# Patient Record
Sex: Female | Born: 1960 | Race: White | Hispanic: No | Marital: Married | Smoking: Never smoker
Health system: Southern US, Community
[De-identification: ages and names within clinical notes are randomized; demographics above are authoritative.]

## PROBLEM LIST (undated history)

## (undated) DIAGNOSIS — I499 Cardiac arrhythmia, unspecified: Secondary | ICD-10-CM

## (undated) DIAGNOSIS — F419 Anxiety disorder, unspecified: Secondary | ICD-10-CM

## (undated) DIAGNOSIS — I10 Essential (primary) hypertension: Secondary | ICD-10-CM

## (undated) DIAGNOSIS — F329 Major depressive disorder, single episode, unspecified: Secondary | ICD-10-CM

## (undated) DIAGNOSIS — M1990 Unspecified osteoarthritis, unspecified site: Secondary | ICD-10-CM

## (undated) DIAGNOSIS — J189 Pneumonia, unspecified organism: Secondary | ICD-10-CM

## (undated) DIAGNOSIS — M5412 Radiculopathy, cervical region: Secondary | ICD-10-CM

## (undated) DIAGNOSIS — F32A Depression, unspecified: Secondary | ICD-10-CM

## (undated) DIAGNOSIS — N189 Chronic kidney disease, unspecified: Secondary | ICD-10-CM

## (undated) DIAGNOSIS — E669 Obesity, unspecified: Secondary | ICD-10-CM

## (undated) DIAGNOSIS — D249 Benign neoplasm of unspecified breast: Secondary | ICD-10-CM

## (undated) DIAGNOSIS — K219 Gastro-esophageal reflux disease without esophagitis: Secondary | ICD-10-CM

## (undated) DIAGNOSIS — M5124 Other intervertebral disc displacement, thoracic region: Secondary | ICD-10-CM

## (undated) HISTORY — PX: CHOLECYSTECTOMY: SHX55

## (undated) HISTORY — PX: ABDOMINAL HYSTERECTOMY: SHX81

## (undated) HISTORY — PX: KNEE ARTHROSCOPY: SHX127

## (undated) HISTORY — DX: Major depressive disorder, single episode, unspecified: F32.9

## (undated) HISTORY — DX: Depression, unspecified: F32.A

## (undated) HISTORY — PX: KNEE ARTHROSCOPY: SUR90

## (undated) HISTORY — DX: Pneumonia, unspecified organism: J18.9

## (undated) HISTORY — DX: Unspecified osteoarthritis, unspecified site: M19.90

## (undated) HISTORY — DX: Essential (primary) hypertension: I10

## (undated) HISTORY — PX: BREAST LUMPECTOMY: SHX2

## (undated) HISTORY — DX: Benign neoplasm of unspecified breast: D24.9

---

## 2010-01-11 ENCOUNTER — Ambulatory Visit: Payer: Self-pay | Admitting: Internal Medicine

## 2010-01-11 ENCOUNTER — Telehealth (INDEPENDENT_AMBULATORY_CARE_PROVIDER_SITE_OTHER): Payer: Self-pay | Admitting: *Deleted

## 2010-01-11 DIAGNOSIS — F329 Major depressive disorder, single episode, unspecified: Secondary | ICD-10-CM | POA: Insufficient documentation

## 2010-02-08 ENCOUNTER — Ambulatory Visit: Payer: Self-pay | Admitting: Internal Medicine

## 2010-05-23 ENCOUNTER — Ambulatory Visit: Payer: Self-pay | Admitting: Internal Medicine

## 2010-05-24 ENCOUNTER — Encounter: Payer: Self-pay | Admitting: Internal Medicine

## 2010-06-02 ENCOUNTER — Emergency Department (HOSPITAL_COMMUNITY): Admission: EM | Admit: 2010-06-02 | Discharge: 2010-06-02 | Payer: Self-pay | Admitting: Emergency Medicine

## 2010-06-13 ENCOUNTER — Ambulatory Visit: Payer: Self-pay | Admitting: Internal Medicine

## 2010-06-13 DIAGNOSIS — M542 Cervicalgia: Secondary | ICD-10-CM

## 2010-06-13 LAB — CONVERTED CEMR LAB
ALT: 47 units/L — ABNORMAL HIGH (ref 0–35)
AST: 29 units/L (ref 0–37)
Basophils Absolute: 0 10*3/uL (ref 0.0–0.1)
GFR calc non Af Amer: 71.5 mL/min (ref >60)
HCT: 36.7 % (ref 36.0–46.0)
Lymphs Abs: 2.3 10*3/uL (ref 0.7–4.0)
MCHC: 35.2 g/dL (ref 30.0–36.0)
MCV: 91.9 fL (ref 78.0–100.0)
Monocytes Absolute: 0.7 10*3/uL (ref 0.1–1.0)
RDW: 13.1 % (ref 11.5–14.6)
TSH: 0.99 microintl units/mL (ref 0.35–5.50)
Total Bilirubin: 0.5 mg/dL (ref 0.3–1.2)
WBC: 6.7 10*3/uL (ref 4.5–10.5)

## 2010-08-08 ENCOUNTER — Ambulatory Visit: Payer: Self-pay | Admitting: Internal Medicine

## 2010-08-08 DIAGNOSIS — M65839 Other synovitis and tenosynovitis, unspecified forearm: Secondary | ICD-10-CM

## 2010-08-08 DIAGNOSIS — M65849 Other synovitis and tenosynovitis, unspecified hand: Secondary | ICD-10-CM

## 2010-12-18 ENCOUNTER — Encounter: Payer: Self-pay | Admitting: Gastroenterology

## 2010-12-30 ENCOUNTER — Encounter: Payer: Self-pay | Admitting: Internal Medicine

## 2011-01-08 NOTE — Letter (Signed)
Summary: Results Follow-up Letter  [facility]  [address] [place], [place] [address]   Phone: [phone]  Fax: [phone]    05/24/2010  [address] [place], [place]  [address]  Dear Shari Ruiz,   The following are the results of your recent test(s):  Test     Result     Wrist Xray     normal   _________________________________________________________  Please call for an appointment in 2-3 weeks _________________________________________________________ _________________________________________________________ _________________________________________________________  Sincerely,  Sanda Linger MD [facility]

## 2011-01-08 NOTE — Assessment & Plan Note (Signed)
Summary: NEW [employer] PT--PKG/OFF---STC   Vital Signs:  Patient profile:   50 year old female Height:      66 inches Weight:      220 pounds BMI:     35.64 O2 Sat:      99 % on Room air Temp:     97.6 degrees F oral Pulse rate:   82 / minute Pulse rhythm:   regular Resp:     16 per minute BP sitting:   104 / 68  (left arm) Cuff size:   large  Vitals Entered By: Rock Nephew CMA (January 11, 2010 1:35 PM)  Nutrition Counseling: Patient's BMI is greater than 25 and therefore counseled on weight management options.  O2 Flow:  Room air CC: dizziness, cold, weight gain, URI symptoms, Depression, Preventive Care Is Patient Diabetic? No Pain Assessment Patient in pain? no        Primary Care Provider:  Etta Grandchild MD  CC:  dizziness, cold, weight gain, URI symptoms, Depression, and Preventive Care.  History of Present Illness:  URI Symptoms      This is a 50 year old woman who presents with URI symptoms.  The symptoms began 3 weeks ago.  The severity is described as moderate.  The patient reports purulent nasal discharge, sore throat, and productive cough, but denies earache and sick contacts.  The patient denies fever, stiff neck, dyspnea, wheezing, rash, vomiting, diarrhea, and use of an antipyretic.  The patient denies headache, muscle aches, and severe fatigue.  Risk factors for Strep sinusitis include unilateral facial pain, unilateral nasal discharge, poor response to decongestant, and double sickening.    Depression History:      The patient presents with symptoms of depression which have been present for greater than two weeks.  The patient is having a depressed mood most of the day and has a diminished interest in her usual daily activities.  Positive alarm features for depression include significant weight loss, psychomotor retardation, fatigue (loss of energy), and feelings of worthlessness (guilt).  However, she denies significant weight gain, insomnia, hypersomnia,  psychomotor agitation, impaired concentration (indecisiveness), and recurrent thoughts of death or suicide.  The patient denies symptoms of a manic disorder including persistently & abnormally elevated mood, abnormally & persistently irritable mood, less need for sleep, talkative or feels need to keep talking, distractibility, flight of ideas, and excessive buying sprees.        Psychosocial stress factors include major life changes.  Risk factors for depression include a personal history of depression.  The patient denies that she feels like life is not worth living, denies that she wishes that she were dead, and denies that she has thought about ending her life.         Depression Treatment History:  Prior Medication Used:   Start Date: Assessment of Effect:   Comments:  Celexa (citalopram)     07/12/2009   side effects enough to stop   weight gain   Preventive Screening-Counseling & Management  Alcohol-Tobacco     Smoking Status: never  Caffeine-Diet-Exercise     Does Patient Exercise: yes      Drug Use:  no.    Medications Prior to Update: 1)  None  Current Medications (verified): 1)  None  Allergies (verified): No Known Drug Allergies  Past History:  Past Medical History: Depression  Past Surgical History: Hysterectomy Lumpectomy  Family History: Family History of Alcoholism/Addiction Family History of Arthritis Family History High cholesterol Family  History Hypertension  Social History: Married Never Smoked Alcohol use-yes Drug use-no Regular exercise-yes Smoking Status:  never Drug Use:  no Does Patient Exercise:  yes  Review of Systems       The patient complains of weight gain and depression.  The patient denies anorexia, fever, weight loss, chest pain, syncope, dyspnea on exertion, peripheral edema, prolonged cough, headaches, hemoptysis, abdominal pain, difficulty walking, and angioedema.   Psych:  Complains of anxiety, depression, easily tearful,  and irritability; denies alternate hallucination ( auditory/visual), easily angered, mental problems, panic attacks, sense of great danger, suicidal thoughts/plans, thoughts of violence, unusual visions or sounds, and thoughts /plans of harming others.  Physical Exam  General:  alert, well-developed, well-nourished, well-hydrated, appropriate dress, normal appearance, healthy-appearing, cooperative to examination, good hygiene, and overweight-appearing.   Head:  normocephalic, atraumatic, no abnormalities observed, and no abnormalities palpated.   Ears:  R ear normal and L ear normal.   Nose:  no airflow obstruction, no intranasal foreign body, no nasal polyps, no nasal mucosal lesions, no mucosal friability, no active bleeding or clots, no septum abnormalities, nasal dischargemucosal pallor, and L maxillary sinus tenderness.   Mouth:  Oral mucosa and oropharynx without lesions or exudates.  Teeth in good repair. Neck:  supple, full ROM, no masses, no thyromegaly, no JVD, normal carotid upstroke, no cervical lymphadenopathy, and no neck tenderness.   Lungs:  normal respiratory effort, no intercostal retractions, no accessory muscle use, normal breath sounds, no dullness, no fremitus, no crackles, and no wheezes.   Heart:  Normal rate and regular rhythm. S1 and S2 normal without gallop, murmur, click, rub or other extra sounds. Abdomen:  soft, non-tender, normal bowel sounds, no distention, no masses, no guarding, no rigidity, no hepatomegaly, and no splenomegaly.   Msk:  No deformity or scoliosis noted of thoracic or lumbar spine.   Pulses:  R and L carotid,radial,femoral,dorsalis pedis and posterior tibial pulses are full and equal bilaterally Extremities:  No clubbing, cyanosis, edema, or deformity noted with normal full range of motion of all joints.   Neurologic:  No cranial nerve deficits noted. Station and gait are normal. Plantar reflexes are down-going bilaterally. DTRs are symmetrical  throughout. Sensory, motor and coordinative functions appear intact. Skin:  Intact without suspicious lesions or rashes Cervical Nodes:  no anterior cervical adenopathy and no posterior cervical adenopathy.   Axillary Nodes:  no R axillary adenopathy and no L axillary adenopathy.   Inguinal Nodes:  no R inguinal adenopathy and no L inguinal adenopathy.   Psych:  Oriented X3, memory intact for recent and remote, normally interactive, good eye contact, not agitated, not suicidal, not homicidal, depressed affect, tearful, and slightly anxious.     Impression & Recommendations:  Problem # 1:  DEPRESSION (ICD-311) Assessment Deteriorated  Her updated medication list for this problem includes:    Cymbalta 30 Mg Cpep (Duloxetine hcl) ..... Once daily  Discussed treatment options, including trial of antidpressant medication. Will refer to behavioral health. Follow-up call in in 24-48 hours and recheck in 2 weeks, sooner as needed. Patient agrees to call if any worsening of symptoms or thoughts of doing harm arise. Verified that the patient has no suicidal ideation at this time.   Problem # 2:  SINUSITIS- ACUTE-NOS (ICD-461.9) Assessment: New  Her updated medication list for this problem includes:    Avelox 400 Mg Tabs (Moxifloxacin hcl) ..... One by mouth once daily for 7 days  Instructed on treatment. Call if symptoms persist or worsen.  Complete Medication List: 1)  Avelox 400 Mg Tabs (Moxifloxacin hcl) .... One by mouth once daily for 7 days 2)  Cymbalta 30 Mg Cpep (Duloxetine hcl) .... Once daily  PAP Screening:    Hx Cervical Dysplasia in last 5 yrs? No    3 normal PAP smears in last 5 yrs? Yes    Last PAP smear:  07/25/2009  PAP Smear Results:    Date of Exam:  07/25/2009    Results:  Normal  Mammogram Screening:    Last Mammogram:  08/01/2009  Mammogram Results:    Date of Exam:  08/01/2009    Results:  Normal Bilateral  Osteoporosis Risk Assessment:  Risk Factors for  Fracture or Low Bone Density:   Race (White or Asian):     yes   Smoking status:       never  Patient Instructions: 1)  Please schedule a follow-up appointment in 1 month. 2)  It is important that you exercise regularly at least 20 minutes 5 times a week. If you develop chest pain, have severe difficulty breathing, or feel very tired , stop exercising immediately and seek medical attention. 3)  You need to lose weight. Consider a lower calorie diet and regular exercise.  4)  Take your antibiotic as prescribed until ALL of it is gone, but stop if you develop a rash or swelling and contact our office as soon as possible. 5)  Acute sinusitis symptoms for less than 10 days are not helped by antibiotics.Use warm moist compresses, and over the counter decongestants ( only as directed). Call if no improvement in 5-7 days, sooner if increasing pain, fever, or new symptoms. Prescriptions: CYMBALTA 30 MG CPEP (DULOXETINE HCL) once daily  #42 x 0   Entered and Authorized by:   Etta Grandchild MD   Signed by:   Etta Grandchild MD on 01/11/2010   Method used:   Samples Given   RxID:   [id number] AVELOX 400 MG TABS (MOXIFLOXACIN HCL) One by mouth once daily for 7 days  #7 x 0   Entered and Authorized by:   Etta Grandchild MD   Signed by:   Etta Grandchild MD on 01/11/2010   Method used:   Samples Given   RxID:   [id number]

## 2011-01-08 NOTE — Assessment & Plan Note (Signed)
Summary: 3 mos f/u //#/cd   Vital Signs:  Patient profile:   50 year old female Height:      66 inches Weight:      227 pounds BMI:     36.77 O2 Sat:      95 % on Room air Temp:     97.9 degrees F oral Pulse rate:   89 / minute Pulse rhythm:   regular Resp:     16 per minute BP sitting:   122 / 82  (left arm) Cuff size:   large  Vitals Entered By: Lucious Groves (May 23, 2010 3:50 PM)  Nutrition Counseling: Patient's BMI is greater than 25 and therefore counseled on weight management options.  O2 Flow:  Room air CC: 3 mo f/u./kb Is Patient Diabetic? No Pain Assessment Patient in pain? yes     Location: hand Intensity: 1 Type: aching Onset of pain  4 weeks ago Comments Patient notes that she injured her hand 4 weeks ago and would like MD to look at it. Pain increases with usage./kb   Primary Care Provider:  Etta Grandchild MD  CC:  3 mo f/u./kb.  History of Present Illness: She returns complaining that she has injured her right wrist 2x in the last few weeks and she is concerned that it may be broken. She has pain over the lateral aspect of the wrist. She has not taken anything for pain.  Preventive Screening-Counseling & Management  Alcohol-Tobacco     Alcohol drinks/day: 0     Smoking Status: never  Caffeine-Diet-Exercise     PHQ-9 Score: 1-4 minimal depression  Hep-HIV-STD-Contraception     Hepatitis Risk: no risk noted     HIV Risk: no risk noted     STD Risk: no risk noted  Medications Prior to Update: 1)  Cymbalta 60 Mg Cpep (Duloxetine Hcl) .... One By Mouth Once Daily  Current Medications (verified): 1)  Cymbalta 60 Mg Cpep (Duloxetine Hcl) .... One By Mouth Once Daily 2)  Celebrex 200 Mg Caps (Celecoxib) .... One By Mouth Once Daily As Needed For Wrist Pain  Allergies (verified): No Known Drug Allergies  Past History:  Past Medical History: Last updated: 01/11/2010 Depression  Past Surgical History: Last updated:  01/11/2010 Hysterectomy Lumpectomy  Family History: Last updated: 01/11/2010 Family History of Alcoholism/Addiction Family History of Arthritis Family History High cholesterol Family History Hypertension  Social History: Last updated: 01/11/2010 Married Never Smoked Alcohol use-yes Drug use-no Regular exercise-yes  Risk Factors: Alcohol Use: 0 (05/23/2010) Exercise: yes (01/11/2010)  Risk Factors: Smoking Status: never (05/23/2010)  Family History: Reviewed history from 01/11/2010 and no changes required. Family History of Alcoholism/Addiction Family History of Arthritis Family History High cholesterol Family History Hypertension  Social History: Reviewed history from 01/11/2010 and no changes required. Married Never Smoked Alcohol use-yes Drug use-no Regular exercise-yes Hepatitis Risk:  no risk noted HIV Risk:  no risk noted STD Risk:  no risk noted  Review of Systems MS:  Complains of joint pain; denies joint redness, joint swelling, loss of strength, muscle aches, muscle, muscle weakness, and stiffness. Psych:  Denies alternate hallucination ( auditory/visual), anxiety, depression, easily angered, easily tearful, irritability, panic attacks, sense of great danger, and suicidal thoughts/plans.  Physical Exam  General:  alert, well-developed, well-nourished, well-hydrated, appropriate dress, normal appearance, healthy-appearing, cooperative to examination, good hygiene, and overweight-appearing.   Msk:  joint tenderness over the tip of the radius and mildly positive tinel's test. normal ROM, no joint swelling, no  joint warmth, no redness over joints, no joint deformities, no joint instability, no crepitation, no muscle atrophy, and joint tenderness.   Pulses:  R radial normal.   Extremities:  No clubbing, cyanosis, edema, or deformity noted with normal full range of motion of all joints.   Neurologic:  No cranial nerve deficits noted. Station and gait are  normal. Plantar reflexes are down-going bilaterally. DTRs are symmetrical throughout. Sensory, motor and coordinative functions appear intact. Skin:  Intact without suspicious lesions or rashes Psych:  Cognition and judgment appear intact. Alert and cooperative with normal attention span and concentration. No apparent delusions, illusions, hallucinations   Impression & Recommendations:  Problem # 1:  WRIST INJURY, RIGHT (ICD-959.3) will look for fracture and treat for sprain, contusion, and overuse syndrome with celebrex Orders: Splints- All Types (G4010) T-Wrist Comp Right (73110TC)  Problem # 2:  DEPRESSION (ICD-311) Assessment: Improved  Her updated medication list for this problem includes:    Cymbalta 60 Mg Cpep (Duloxetine hcl) ..... One by mouth once daily  Discussed treatment options, including trial of antidpressant medication. Will refer to behavioral health. Follow-up call in in 24-48 hours and recheck in 2 weeks, sooner as needed. Patient agrees to call if any worsening of symptoms or thoughts of doing harm arise. Verified that the patient has no suicidal ideation at this time.   Complete Medication List: 1)  Cymbalta 60 Mg Cpep (Duloxetine hcl) .... One by mouth once daily 2)  Celebrex 200 Mg Caps (Celecoxib) .... One by mouth once daily as needed for wrist pain  Patient Instructions: 1)  Please schedule a follow-up appointment in 2 weeks. 2)  It is important that you exercise regularly at least 20 minutes 5 times a week. If you develop chest pain, have severe difficulty breathing, or feel very tired , stop exercising immediately and seek medical attention. 3)  You need to lose weight. Consider a lower calorie diet and regular exercise.  4)  Take 650-1000mg  of Tylenol every 4-6 hours as needed for relief of pain or comfort of fever AVOID taking more than 4000mg   in a 24 hour period (can cause liver damage in higher doses). 5)  You may move around but avoid painful motions.  Apply ice to sore area for 20 minutes 3-4 times a day for 2-3 days. Prescriptions: CYMBALTA 60 MG CPEP (DULOXETINE HCL) One by mouth once daily  #105 x 0   Entered and Authorized by:   Etta Grandchild MD   Signed by:   Etta Grandchild MD on 05/23/2010   Method used:   Samples Given   RxID:   [id number] CELEBREX 200 MG CAPS (CELECOXIB) One by mouth once daily as needed for wrist pain  #30 x 0   Entered and Authorized by:   Etta Grandchild MD   Signed by:   Etta Grandchild MD on 05/23/2010   Method used:   Samples Given   RxID:   [id number]

## 2011-01-08 NOTE — Progress Notes (Signed)
Summary: Medical Release completed  Medical Release completed to obtain records from Dr. Willa Rough. After several failed fax attempts, records were mailed to [address] [place], [place] [address]. Shari Ruiz  January 11, 2010 3:10 PM

## 2011-01-08 NOTE — Assessment & Plan Note (Signed)
Summary: 4-6 WKS/[employer]   Vital Signs:  Patient profile:   50 year old female Height:      66 inches Weight:      219.75 pounds O2 Sat:      97 % on Room air Temp:     98.5 degrees F oral Pulse rate:   85 / minute Pulse rhythm:   regular Resp:     16 per minute BP sitting:   116 / 78  (left arm) Cuff size:   large  Vitals Entered By: Brenton Grills (February 08, 2010 8:30 AM)  O2 Flow:  Room air CC: follow-up visit/aj, Depression   Primary Care Provider:  Etta Grandchild MD  CC:  follow-up visit/aj and Depression.  History of Present Illness: She returns for f/up and reports that she is having a good response to Cymbalta 30 mg. She feels more motivated and food cravings have diminished.  Depression Treatment History:  Prior Medication Used:   Start Date: Assessment of Effect:   Comments:  Celexa (citalopram)     07/12/2009   side effects enough to stop   weight gain   Current Medications (verified): 1)  Avelox 400 Mg Tabs (Moxifloxacin Hcl) .... One By Mouth Once Daily For 7 Days 2)  Cymbalta 30 Mg Cpep (Duloxetine Hcl) .... Once Daily  Allergies (verified): No Known Drug Allergies  Past History:  Past Medical History: Reviewed history from 01/11/2010 and no changes required. Depression  Past Surgical History: Reviewed history from 01/11/2010 and no changes required. Hysterectomy Lumpectomy  Family History: Reviewed history from 01/11/2010 and no changes required. Family History of Alcoholism/Addiction Family History of Arthritis Family History High cholesterol Family History Hypertension  Social History: Reviewed history from 01/11/2010 and no changes required. Married Never Smoked Alcohol use-yes Drug use-no Regular exercise-yes  Review of Systems  The patient denies anorexia, weight loss, weight gain, chest pain, peripheral edema, abdominal pain, depression, fever, prolonged cough, and enlarged lymph nodes.   Psych:  Denies anxiety, depression,  easily angered, easily tearful, irritability, mental problems, panic attacks, sense of great danger, suicidal thoughts/plans, thoughts of violence, unusual visions or sounds, and thoughts /plans of harming others.  Physical Exam  General:  alert, well-developed, well-nourished, well-hydrated, appropriate dress, normal appearance, healthy-appearing, cooperative to examination, good hygiene, and overweight-appearing.   Mouth:  Oral mucosa and oropharynx without lesions or exudates.  Teeth in good repair. Neck:  supple, full ROM, no masses, no thyromegaly, no JVD, normal carotid upstroke, no cervical lymphadenopathy, and no neck tenderness.   Lungs:  normal respiratory effort, no intercostal retractions, no accessory muscle use, normal breath sounds, no dullness, no fremitus, no crackles, and no wheezes.   Heart:  Normal rate and regular rhythm. S1 and S2 normal without gallop, murmur, click, rub or other extra sounds. Abdomen:  soft, non-tender, normal bowel sounds, no distention, no masses, no guarding, no rigidity, no hepatomegaly, and no splenomegaly.   Msk:  No deformity or scoliosis noted of thoracic or lumbar spine.   Neurologic:  No cranial nerve deficits noted. Station and gait are normal. Plantar reflexes are down-going bilaterally. DTRs are symmetrical throughout. Sensory, motor and coordinative functions appear intact. Skin:  Intact without suspicious lesions or rashes Psych:  Oriented X3, memory intact for recent and remote, normally interactive, good eye contact, not anxious appearing, not depressed appearing, not agitated, not suicidal, and not homicidal.     Impression & Recommendations:  Problem # 1:  DEPRESSION (ICD-311) Assessment Improved  will  increase Cymbalta dose to a therapeutic level The following medications were removed from the medication list:    Cymbalta 30 Mg Cpep (Duloxetine hcl) ..... Once daily Her updated medication list for this problem includes:    Cymbalta  60 Mg Cpep (Duloxetine hcl) ..... One by mouth once daily  Discussed treatment options, including trial of antidpressant medication. Will refer to behavioral health. Follow-up call in in 24-48 hours and recheck in 2 weeks, sooner as needed. Patient agrees to call if any worsening of symptoms or thoughts of doing harm arise. Verified that the patient has no suicidal ideation at this time.   Complete Medication List: 1)  Cymbalta 60 Mg Cpep (Duloxetine hcl) .... One by mouth once daily  Patient Instructions: 1)  Please schedule a follow-up appointment in 3 months. 2)  It is important that you exercise regularly at least 20 minutes 5 times a week. If you develop chest pain, have severe difficulty breathing, or feel very tired , stop exercising immediately and seek medical attention. 3)  You need to lose weight. Consider a lower calorie diet and regular exercise.  Prescriptions: CYMBALTA 60 MG CPEP (DULOXETINE HCL) One by mouth once daily  #105 x 0   Entered and Authorized by:   Etta Grandchild MD   Signed by:   Etta Grandchild MD on 02/08/2010   Method used:   Samples Given   RxID:   [id number]
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
## 2011-01-10 NOTE — Letter (Signed)
Summary: New Patient letter  [facility]  [address] [place], [place] [address]   Phone: [phone]  Fax: [phone]       12/18/2010 MRN: [id number]  Shari Ruiz [address] [place], [place]  [address]  Dear Shari Ruiz,  Welcome to the Gastroenterology Division at [facility]    You are scheduled to see Dr.  Christella Hartigan on 01-23-2011 at 11am on the 3rd floor at [facility], [address]  We ask that you try to arrive at our office 15 minutes prior to your appointment time to allow for check-in.  We would like you to complete the enclosed self-administered evaluation form prior to your visit and bring it with you on the day of your appointment.  We will review it with you.  Also, please bring a complete list of all your medications or, if you prefer, bring the medication bottles and we will list them.  Please bring your insurance card so that we may make a copy of it.  If your insurance requires a referral to see a specialist, please bring your referral form from your primary care physician.  Co-payments are due at the time of your visit and may be paid by cash, check or credit card.     Your office visit will consist of a consult with your physician (includes a physical exam), any laboratory testing he/she may order, scheduling of any necessary diagnostic testing (e.g. x-ray, ultrasound, CT-scan), and scheduling of a procedure (e.g. Endoscopy, Colonoscopy) if required.  Please allow enough time on your schedule to allow for any/all of these possibilities.    If you cannot keep your appointment, please call [phone] to cancel or reschedule prior to your appointment date.  This allows [place] the opportunity to schedule an appointment for another patient in need of care.  If you do not cancel or reschedule by 5 p.m. the business day prior to your appointment date, you will be charged a $50.00 late cancellation/no-show fee.    Thank you for choosing [facility] for your medical needs.  We appreciate the opportunity to care for you.  Please visit [place] at our website  to learn more about our practice.                     Sincerely,                                                             The Gastroenterology Division

## 2011-01-23 ENCOUNTER — Encounter: Payer: Self-pay | Admitting: Gastroenterology

## 2011-01-23 ENCOUNTER — Ambulatory Visit (INDEPENDENT_AMBULATORY_CARE_PROVIDER_SITE_OTHER): Payer: BC Managed Care – PPO | Admitting: Gastroenterology

## 2011-01-23 DIAGNOSIS — R05 Cough: Secondary | ICD-10-CM

## 2011-01-30 NOTE — Assessment & Plan Note (Signed)
History of Present Illness Visit Type: Initial Consult Primary GI MD: Rob Bunting MD Primary Provider: Etta Grandchild MD Requesting Provider: Marisue [place], DO Chief Complaint: Since October pt has hoarseness and coughing. Pt has some LUQ abd pressure and intermittant nausea/ diarhea.  History of Present Illness:     very pleasant 50 year old woman who this past october, was sick with vomiting, cough, fever.  She still has a cough, she was told it was GERD related.  Was put on antiacid med and allergy med daily.  She still has cough and hoarsness.  this is a dry cough she started ACE inhibitor about 2-3 months ago.  she also has left upper quadrant discomfort.  This starts like a pushing, then is sharp and around to left back. Feels it every day, but sharp pains only about once a day.  Had [place] but no CT.  she has lost 10 pounds since october.  she had similar symptoms about 4 years ago..."every test under the sun" was done.  had colonoscopy/egd were done, she was told she had a "severe" infection and was put on a z pack.  she had a "colonic" clensing of colon here in [place] 2 months ago.             Current Medications (verified): 1)  Lisinopril-Hydrochlorothiazide 10-12.5 Mg Tabs (Lisinopril-Hydrochlorothiazide) .... One Tablet By Mouth Once Daily 2)  [facility] Allergy 25 Mg Tabs (Diphenhydramine Hcl) .... One Tablet By Mouth Once Daily 3)  Ranitidine Hcl 150 Mg Tabs (Ranitidine Hcl) .... 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  All other review of systems was otherwise negative.   Vital Signs:  Patient profile:   50 year old female Height:      66 inches Weight:      227.13 pounds BMI:     36.79 Pulse rate:   80 / minute Pulse rhythm:   regular BP sitting:   128 / 70  (left arm) Cuff size:   large  Vitals Entered By: Christie Nottingham CMA Duncan Dull) (January 23, 2011 11:08 AM)  Physical Exam  Additional Exam:  Constitutional: generally well appearing Psychiatric: alert and oriented times 3 Eyes: extraocular movements intact Mouth: oropharynx moist, no lesions Neck: supple, no lymphadenopathy Cardiovascular: heart regular rate and rythm Lungs: CTA bilaterally Abdomen: soft, non-tender, non-distended, no obvious ascites, no peritoneal signs, normal bowel sounds Extremities: no lower extremity edema bilaterally Skin: no lesions on visible extremities    Impression & Recommendations:  Problem # 1:  Cough perhaps ACE inhibitor related. Could be GERD related but she doesn't have other more classic signs of GERD such as pyrosis. I recommended she stop her ACE inhibitor. She will call my office in about 3 weeks to report on her symptoms. If she has not improved within we will start her on samples of proton pump inhibitor. We will get copies of her colonoscopy, upper endoscopy report sent  from [facility] for review.  Patient Instructions: 1)  We will get the results from colonoscopy and upper endoscopy that were done in [place] 4 years ago. 2)  Stop your lisinopril (cough is a common side effect of this type of medicine).  Call your PCP about a diffent BP med for now.  Call Dr. Christella Hartigan' office in 3-4 weeks to report on your symptoms.  If NO improvement, then will change your antiacid meds to stronger class  (PPI class of meds). 3)  You may need repeat endoscopy or colonoscopy, but that will depend on symptoms and review of previous EGD/colonoscopy. 4)  A copy of this information will be sent to Dr. Elisabeth Most. 5)  The medication list was reviewed and reconciled.  All changed / newly prescribed medications were explained.  A complete medication list was provided to the patient / caregiver.

## 2011-02-07 ENCOUNTER — Encounter (INDEPENDENT_AMBULATORY_CARE_PROVIDER_SITE_OTHER): Payer: Self-pay | Admitting: *Deleted

## 2011-02-07 ENCOUNTER — Telehealth: Payer: Self-pay | Admitting: Gastroenterology

## 2011-02-12 ENCOUNTER — Encounter (INDEPENDENT_AMBULATORY_CARE_PROVIDER_SITE_OTHER): Payer: Self-pay | Admitting: *Deleted

## 2011-02-14 ENCOUNTER — Encounter: Payer: Self-pay | Admitting: Gastroenterology

## 2011-02-14 NOTE — Progress Notes (Signed)
Summary: Triage  Phone Note Call from Patient Call back at [phone]   Caller: Patient Call For: Dr. Christella Hartigan Reason for Call: Talk to Nurse Summary of Call: "sick and tired of not feeling well", abd pain, bloating, gas. Her PCP prescribed her Align and it is causing extreme constipation Initial call taken by: Karna Christmas,  February 07, 2011 8:30 AM  Follow-up for Phone Call        resent release to [facility] for Dr Christella Hartigan review. I also called the facility and they state the pt has never been seen there before.  I ahve placed a call to the pt to gather further information left message on machine to call back   Chales Abrahams CMA Duncan Dull)  February 07, 2011 8:37 AM   Additional Follow-up for Phone Call Additional follow up Details #1::        pt has a "pulsing pain" aroung the belly button and left side is very bloated and she feels very fatigued.  She also has the same cough.  She is very anxious about colon cancer in the family.  [facility] has no record of the pt and the the pt cant remember any other info regarding her procedures.  She wants to be scheduled for a Colon Endo.  She is very tired of feeling bad and hurting.  Please advise. Additional Follow-up by: Chales Abrahams CMA Duncan Dull),  February 07, 2011 9:15 AM    Additional Follow-up for Phone Call Additional follow up Details #2::    since we cannot find her previous EGD/colon reports and patient is unable to help [place] locate them any further, we will have to repeat those tests. [facility] with propofol (EGD and colonoscopy). Follow-up by: Rachael Fee MD,  February 07, 2011 9:43 AM  Additional Follow-up for Phone Call Additional follow up Details #3:: Details for Additional Follow-up Action Taken: left message on machine to call back Chales Abrahams CMA Duncan Dull)  February 07, 2011 10:19 AM   pt scheduled for endo colon and previsit pt aware previsit letter mailed. Additional Follow-up by: Chales Abrahams CMA Duncan Dull),  February 07, 2011 10:44 AM

## 2011-02-14 NOTE — Letter (Signed)
Summary: Previsit letter  [facility]  [address] [place], [place] [address]   Phone: [phone]  Fax: [phone]       02/07/2011 MRN: [id number]  Shari Ruiz [address] [place], [place]  [address]  [place]  Dear [name],  Welcome to the Gastroenterology Division at [facility].    You are scheduled to see a nurse for your pre-procedure visit on 02/14/11  at 4 pm on the 3rd floor at [facility], [address]  We ask that you try to arrive at our office 15 minutes prior to your appointment time to allow for check-in.  Your nurse visit will consist of discussing your medical and surgical history, your immediate family medical history, and your medications.    Please bring a complete list of all your medications or, if you prefer, bring the medication bottles and we will list them.  We will need to be aware of both prescribed and over the counter drugs.  We will need to know exact dosage information as well.  If you are on blood thinners (Coumadin, Plavix, Aggrenox, Ticlid, etc.) please call our office today/prior to your appointment, as we need to consult with your physician about holding your medication.   Please be prepared to read and sign documents such as consent forms, a financial agreement, and acknowledgement forms.  If necessary, and with your consent, a friend or relative is welcome to sit-in on the nurse visit with you.  Please bring your insurance card so that we may make a copy of it.  If your insurance requires a referral to see a specialist, please bring your referral form from your primary care physician.  No co-pay is required for this nurse visit.     If you cannot keep your appointment, please call [phone] to cancel or reschedule prior to your appointment date.  This allows [place] the opportunity to schedule an appointment for another patient in need of care.    Thank you for choosing [facility] for your medical needs.   We appreciate the opportunity to care for you.  Please visit [place] at our website  to learn more about our practice.                     Sincerely.                                                                                                                   The Gastroenterology Division

## 2011-02-19 NOTE — Miscellaneous (Signed)
Summary: [facility]  Clinical Lists Changes  Medications: Added new medication of MOVIPREP 100 GM  SOLR (PEG-KCL-NACL-NASULF-NA ASC-C) As per prep instructions. - Signed Rx of MOVIPREP 100 GM  SOLR (PEG-KCL-NACL-NASULF-NA ASC-C) As per prep instructions.;  #1 x 0;  Signed;  Entered by: Ezra Sites RN;  Authorized by: Rachael Fee MD;  Method used: Electronically to [facility] # [phone]*, [address] [place], [place], [place]  [address], Ph: [phone], Fax: [phone] Observations: Added new observation of NKA: T (02/14/2011 15:59)    Prescriptions: MOVIPREP 100 GM  SOLR (PEG-KCL-NACL-NASULF-NA ASC-C) As per prep instructions.  #1 x 0   Entered by:   Ezra Sites RN   Authorized by:   Rachael Fee MD   Signed by:   Ezra Sites RN on 02/14/2011   Method used:   Electronically to        [facility] # [phone]* (retail)       [address] [place], [place]  [address]       Ph: [phone]       Fax: [phone]   RxID:   [id number]

## 2011-02-19 NOTE — Letter (Signed)
Summary: [facility] Instructions   Gastroenterology  [address] [place], [place] [address]   Phone: [phone]  Fax: [phone]       Shari Ruiz    07-08-1961    MRN: [id number]        Procedure Day Dorna Bloom: Lenor Coffin 02/21/11     Arrival Time: 10:15am      Procedure Time:12:15pm     Location of Procedure:                     Juliann Pares _  [facility] ( Outpatient Registration)                       PREPARATION FOR COLONOSCOPY WITH MOVIPREP   Starting 5 days prior to your procedure  SATURDAY 03/10  do not eat nuts, seeds, popcorn, corn, beans, peas,  salads, or any raw vegetables.  Do not take any fiber supplements (e.g. Metamucil, Citrucel, and Benefiber).  THE DAY BEFORE YOUR PROCEDURE         DATE: [facility]  03/14  1.  Drink clear liquids the entire day-NO SOLID FOOD  2.  Do not drink anything colored red or purple.  Avoid juices with pulp.  No orange juice.  3.  Drink at least 64 oz. (8 glasses) of fluid/clear liquids during the day to prevent dehydration and help the prep work efficiently.  CLEAR LIQUIDS INCLUDE: Water Jello Ice Popsicles Tea (sugar ok, no milk/cream) Powdered fruit flavored drinks Coffee (sugar ok, no milk/cream) Gatorade Juice: apple, white grape, white cranberry  Lemonade Clear bullion, consomm, broth Carbonated beverages (any kind) Strained chicken noodle soup Hard Candy                             4.  In the morning, mix first dose of MoviPrep solution:    Empty 1 Pouch A and 1 Pouch B into the disposable container    Add lukewarm drinking water to the top line of the container. Mix to dissolve    Refrigerate (mixed solution should be used within 24 hrs)  5.  Begin drinking the prep at 5:00 p.m. The MoviPrep container is divided by 4 marks.   Every 15 minutes drink the solution down to the next mark (approximately 8 oz) until the full liter is complete.   6.  Follow completed prep with 16 oz of clear liquid of your  choice (Nothing red or purple).  Continue to drink clear liquids until bedtime.  7.  Before going to bed, mix second dose of MoviPrep solution:    Empty 1 Pouch A and 1 Pouch B into the disposable container    Add lukewarm drinking water to the top line of the container. Mix to dissolve    Refrigerate  THE DAY OF YOUR PROCEDURE      DATE: THURSDAY  03/15  Beginning at  7:15a.m. (5 hours before procedure):         1. Every 15 minutes, drink the solution down to the next mark (approx 8 oz) until the full liter is complete.  2. Follow completed prep with 16 oz. of clear liquid of your choice.    3. NPO PAST MIDNIGHT EXCEPT FOR PREP!   MEDICATION INSTRUCTIONS  Unless otherwise instructed, you should take regular prescription medications with a small sip of water   as early as possible the morning of your procedure.   Additional  medication instructions: _If you take a fluid pill, do not take day of procedure.       OTHER INSTRUCTIONS  You will need a responsible adult at least 51 years of age to accompany you and drive you home.   This person must remain in the waiting room during your procedure.  Wear loose fitting clothing that is easily removed.  Leave jewelry and other valuables at home.  However, you may wish to bring a book to read or  an iPod/MP3 player to listen to music as you wait for your procedure to start.  Remove all body piercing jewelry and leave at home.  Total time from sign-in until discharge is approximately 2-3 hours.  You should go home directly after your procedure and rest.  You can resume normal activities the  day after your procedure.  The day of your procedure you should not:   Drive   Make legal decisions   Operate machinery   Drink alcohol   Return to work  You will receive specific instructions about eating, activities and medications before you leave.    The above instructions have been reviewed and explained to me by   Ezra Sites RN  February 14, 2011 4:13 PM   I fully understand and can verbalize these instructions _____________________________ Date _________

## 2011-02-21 ENCOUNTER — Encounter: Payer: Self-pay | Admitting: Gastroenterology

## 2011-02-21 ENCOUNTER — Encounter: Payer: BC Managed Care – PPO | Admitting: Gastroenterology

## 2011-02-21 ENCOUNTER — Ambulatory Visit (HOSPITAL_COMMUNITY)
Admission: RE | Admit: 2011-02-21 | Discharge: 2011-02-21 | Disposition: A | Discharge status: Home / Self Care | Payer: BC Managed Care – PPO | Source: Ambulatory Visit | Attending: Gastroenterology | Admitting: Gastroenterology

## 2011-02-21 ENCOUNTER — Other Ambulatory Visit: Payer: Self-pay | Admitting: Gastroenterology

## 2011-02-21 DIAGNOSIS — R1013 Epigastric pain: Secondary | ICD-10-CM

## 2011-02-21 DIAGNOSIS — K219 Gastro-esophageal reflux disease without esophagitis: Secondary | ICD-10-CM | POA: Insufficient documentation

## 2011-02-21 DIAGNOSIS — R05 Cough: Secondary | ICD-10-CM

## 2011-02-21 DIAGNOSIS — I10 Essential (primary) hypertension: Secondary | ICD-10-CM | POA: Insufficient documentation

## 2011-02-21 DIAGNOSIS — Z90710 Acquired absence of both cervix and uterus: Secondary | ICD-10-CM | POA: Insufficient documentation

## 2011-02-21 DIAGNOSIS — R1012 Left upper quadrant pain: Secondary | ICD-10-CM

## 2011-02-21 DIAGNOSIS — K3189 Other diseases of stomach and duodenum: Secondary | ICD-10-CM

## 2011-02-21 DIAGNOSIS — K648 Other hemorrhoids: Secondary | ICD-10-CM | POA: Insufficient documentation

## 2011-02-21 DIAGNOSIS — D126 Benign neoplasm of colon, unspecified: Secondary | ICD-10-CM | POA: Insufficient documentation

## 2011-02-22 ENCOUNTER — Encounter: Payer: Self-pay | Admitting: Gastroenterology

## 2011-02-22 ENCOUNTER — Encounter: Payer: Self-pay | Admitting: Internal Medicine

## 2011-02-25 ENCOUNTER — Other Ambulatory Visit: Payer: Self-pay | Admitting: Gastroenterology

## 2011-02-25 ENCOUNTER — Encounter (INDEPENDENT_AMBULATORY_CARE_PROVIDER_SITE_OTHER): Payer: Self-pay | Admitting: *Deleted

## 2011-02-25 DIAGNOSIS — R109 Unspecified abdominal pain: Secondary | ICD-10-CM

## 2011-02-26 NOTE — Miscellaneous (Signed)
Summary: rx  Clinical Lists Changes  Medications: Removed medication of MOVIPREP 100 GM  SOLR (PEG-KCL-NACL-NASULF-NA ASC-C) As per prep instructions. Added new medication of PREVACID 30 MG  CPDR (LANSOPRAZOLE) 1 capsule each day 20-30 min prior to BF and dinner meals - Signed Rx of PREVACID 30 MG  CPDR (LANSOPRAZOLE) 1 capsule each day 20-30 min prior to BF and dinner meals;  #60 x 2;  Signed;  Entered by: Rachael Fee MD;  Authorized by: Rachael Fee MD;  Method used: Print then Give to Patient    Prescriptions: PREVACID 30 MG  CPDR (LANSOPRAZOLE) 1 capsule each day 20-30 min prior to BF and dinner meals  #60 x 2   Entered and Authorized by:   Rachael Fee MD   Signed by:   Rachael Fee MD on 02/21/2011   Method used:   Print then Give to Patient   RxID:   [id number]

## 2011-02-26 NOTE — Procedures (Signed)
Summary: Instructions for procedure/[place]  Instructions for procedure/[place]   Imported By: Sherian Rein 02/19/2011 13:55:06  _____________________________________________________________________  External Attachment:    Type:   Image     Comment:   External Document

## 2011-02-26 NOTE — Procedures (Signed)
Summary: Colonoscopy  Patient: Shari Ruiz Note: All result statuses are Final unless otherwise noted.  Tests: (1) Colonoscopy (COL)   COL Colonoscopy           DONE (C)     [facility]     [address] [place], [place]  [address]          COLONOSCOPY PROCEDURE REPORT          PATIENT:  Shari Ruiz, Shari Ruiz  MR#:  [id number]     BIRTHDATE:  [birth year], 49 yrs. old  GENDER:  female     ENDOSCOPIST:  Rachael Fee, MD     REF. BY:  Etta Grandchild, M.D.     PROCEDURE DATE:  02/21/2011     PROCEDURE:  Colonoscopy with snare polypectomy     ASA CLASS:  Class II     INDICATIONS:  left sided abd pain; underwent colonoscopy 3-4 years     ago, we have tried several times but have not been able to locate     a copy of the procedure, pt not sure what was found     MEDICATIONS:  MAC sedation, administered by CRNA          DESCRIPTION OF PROCEDURE:   After the risks benefits and     alternatives of the procedure were thoroughly explained, informed     consent was obtained.  Digital rectal exam was performed and     revealed no rectal masses.   The Pentax Colonoscope [id number]     endoscope was introduced through the anus and advanced to the     terminal ileum which was intubated for a short distance, without     limitations.  The quality of the prep was good, using MoviPrep.     The instrument was then slowly withdrawn as the colon was fully     examined.     <<PROCEDUREIMAGES>>     FINDINGS:  A diminutive polyp was found in the sigmoid colon. This     was removed with cold snare and sent to pathology (jar 1) (see     image004).  The terminal ileum appeared normal (see image002).     This was otherwise a normal examination of the colon (see image001,     image003, and image005).  Internal Hemorrhoids were found.     Retroflexed views in the rectum revealed no abnormalities.    The     scope was then withdrawn from the patient and the procedure     completed.  COMPLICATIONS:  None          ENDOSCOPIC IMPRESSION:     1) Diminutive polyp in the sigmoid colon, removed and sent to     pathology     2) Normal terminal ileum          3) Small internal hemorrhoids          RECOMMENDATIONS:     1) If the polyp removed today is pre-cancerous on biopsy, then     you will need a repeat colonoscopy in 5 years.  Otherwise you     should have repeat colonoscopy in 10 years for routine colon     cancer screening.          ______________________________     Rachael Fee, MD          n.     REVISED:  02/21/2011 12:18 PM     eSIGNED:  Rachael Fee at 02/21/2011 12:18 PM          [place], [place], [place] [id number]  Note: An exclamation mark (!) indicates a result that was not dispersed into the flowsheet. Document Creation Date: 02/21/2011 12:19 PM _______________________________________________________________________  (1) Order result status: Final Collection or observation date-time: 02/21/2011 11:44 Requested date-time:  Receipt date-time:  Reported date-time:  Referring Physician:   Ordering Physician: Rob Bunting [phone]) Specimen Source:  Source: Launa Grill Order Number: [id number] Lab site:

## 2011-02-26 NOTE — Letter (Signed)
Summary: Results Letter  [facility]  [address] [place], [place] [address]   Phone: [phone]  Fax: [phone]        February 22, 2011 MRN: [id number]    [facility] [address] [place], [place]  [address]    Dear Ms. Grisanti,   At least one of the polyps removed during your recent procedure was proven to be adenomatous.  These are pre-cancerous polyps that may have grown into cancers if they had not been removed.  Based on current nationally recognized surveillance guidelines, I recommend that you have a repeat colonoscopy in 5 years.  We will therefore put your information in our reminder system and will contact you in 5 years to schedule a repeat procedure.  Please call if you have any questions or concerns.       Sincerely,  Rachael Fee MD  This letter has been electronically signed by your physician.  Appended Document: Results Letter letter mailed

## 2011-02-26 NOTE — Procedures (Addendum)
Summary: Upper Endoscopy  Patient: [name] Note: All result statuses are Final unless otherwise noted.  Tests: (1) Upper Endoscopy (EGD)   EGD Upper Endoscopy       DONE     [facility]     [address] [place], [place]  [address]          ENDOSCOPY PROCEDURE REPORT          PATIENT:  Shari Ruiz, Shari Ruiz  MR#:  [id number]     BIRTHDATE:  1961/10/14, 49 yrs. old  GENDER:  female     ENDOSCOPIST:  Rachael Fee, MD     PROCEDURE DATE:  02/21/2011     PROCEDURE:  EGD, diagnostic 43235     ASA CLASS:  Class II     INDICATIONS:  chronic cough, dyspepsia     MEDICATIONS:  MAC sedation, administered by CRNA     TOPICAL ANESTHETIC:  Cetacaine Spray          DESCRIPTION OF PROCEDURE:   After the risks benefits and     alternatives of the procedure were thoroughly explained, informed     consent was obtained.  The [id number] ([id number]) endoscope was     introduced through the mouth and advanced to the second portion of     the duodenum, without limitations.  The instrument was slowly     withdrawn as the mucosa was fully examined.     <<PROCEDUREIMAGES>>     The upper, middle, and distal third of the esophagus were     carefully inspected and no abnormalities were noted. The z-line     was well seen at the GEJ. The endoscope was pushed into the fundus     which was normal including a retroflexed view. The antrum,gastric     body, first and second part of the duodenum were unremarkable (see     image1, image2, image4, image5, and image6).    Retroflexed views     revealed no abnormalities.    The scope was then withdrawn from     the patient and the procedure completed.     COMPLICATIONS:  None          ENDOSCOPIC IMPRESSION:     1) Normal EGD          RECOMMENDATIONS:     It is not clear that your cough is related to GI tract (reflux).     Increase your antiacid med (prevacid) to twice daily (20-30 min     prior to BF and dinner meals) for 2 months and then call  Dr.     Christella Hartigan' office to report on your symptoms.     Dr. Christella Hartigan' office will set up CT scan abd/pelvis to further     workup left sided abd pains.          ______________________________     Rachael Fee, MD          cc: Sanda Linger, MD          n.     eSIGNED:   Rachael Fee at 02/21/2011 12:05 PM          [place], Myrene Buddy, [id number]  Note: An exclamation mark (!) indicates a result that was not dispersed into the flowsheet. Document Creation Date: 02/21/2011 12:06 PM _______________________________________________________________________  (1) Order result status: Final Collection or observation date-time: 02/21/2011 11:55 Requested date-time:  Receipt date-time:  Reported date-time:  Referring Physician:  Ordering Physician: Rob Bunting [phone]) Specimen Source:  Source: Launa Grill Order Number: [id number] Lab site:   Appended Document: Upper Endoscopy patty, she needs CT scan abd/pelvis with IV and oral contrast for left sided abd pains.  Appended Document: Upper Endoscopy left message on machine to call back   Appended Document: Upper Endoscopy left message on machine to call back letter mailed  Appended Document: Orders Update/CT pt aware and instructed she will pick up the contrast and instructions from the front desk.   Clinical Lists Changes  Problems: Added new problem of ABDOMINAL PAIN OTHER SPECIFIED SITE (ICD-789.09) Orders: Added new Referral order of CT Abdomen/Pelvis with Contrast (CT Abd/Pelvis w/con) - Signed

## 2011-02-27 ENCOUNTER — Telehealth: Payer: Self-pay | Admitting: Gastroenterology

## 2011-02-27 NOTE — Telephone Encounter (Signed)
Pt wanted to be sure that she had the correct contrast, I advised that she should have redi cat and she does I also gave her the instructions she verbalized understanding and will call with any further questions or concerns

## 2011-03-01 ENCOUNTER — Ambulatory Visit (INDEPENDENT_AMBULATORY_CARE_PROVIDER_SITE_OTHER)
Admission: RE | Admit: 2011-03-01 | Discharge: 2011-03-01 | Disposition: A | Discharge status: Home / Self Care | Payer: BC Managed Care – PPO | Source: Ambulatory Visit | Attending: Gastroenterology | Admitting: Gastroenterology

## 2011-03-01 DIAGNOSIS — R109 Unspecified abdominal pain: Secondary | ICD-10-CM

## 2011-03-01 MED ORDER — IOHEXOL 300 MG/ML  SOLN
100.0000 mL | Freq: Once | INTRAMUSCULAR | Status: AC | PRN
  Administered 2011-03-01: 100 mL via INTRAVENOUS

## 2011-03-04 ENCOUNTER — Institutional Professional Consult (permissible substitution): Payer: BC Managed Care – PPO | Admitting: Internal Medicine

## 2011-03-07 NOTE — Letter (Signed)
Summary: Results Letter  [facility]  [address] [place], [place] [address]   Phone: [phone]  Fax: [phone]        February 25, 2011 MRN: [id number]    [facility] [address] [place], [place]  [address]    Dear Shari Ruiz,   We have been unable to reach you by phone regarding the CT scan that Dr Christella Hartigan would like to schedule for you.  Please call at your earliest convenience so that we may get this scheduled.      Sincerely,  Chales Abrahams CMA (AAMA)  This letter has been electronically signed by your physician.  Appended Document: Results Letter letter mailed

## 2011-03-27 ENCOUNTER — Other Ambulatory Visit (HOSPITAL_COMMUNITY): Payer: Self-pay | Admitting: Internal Medicine

## 2011-03-27 DIAGNOSIS — R1011 Right upper quadrant pain: Secondary | ICD-10-CM

## 2011-04-04 ENCOUNTER — Encounter (HOSPITAL_COMMUNITY)
Admission: RE | Admit: 2011-04-04 | Discharge: 2011-04-04 | Disposition: A | Discharge status: Home / Self Care | Payer: BC Managed Care – PPO | Source: Ambulatory Visit | Attending: Internal Medicine | Admitting: Internal Medicine

## 2011-04-04 ENCOUNTER — Encounter (HOSPITAL_COMMUNITY): Payer: Self-pay

## 2011-04-04 DIAGNOSIS — R1011 Right upper quadrant pain: Secondary | ICD-10-CM

## 2011-04-04 MED ORDER — TECHNETIUM TC 99M MEBROFENIN IV KIT
5.3000 | PACK | Freq: Once | INTRAVENOUS | Status: AC | PRN
  Administered 2011-04-04: 5.3 via INTRAVENOUS

## 2011-04-04 MED ORDER — SINCALIDE 5 MCG IJ SOLR: 0.0200 ug/kg | Freq: Once | INTRAMUSCULAR | Status: DC

## 2011-12-11 ENCOUNTER — Ambulatory Visit (INDEPENDENT_AMBULATORY_CARE_PROVIDER_SITE_OTHER): Payer: Self-pay | Admitting: General Surgery

## 2011-12-16 ENCOUNTER — Ambulatory Visit (INDEPENDENT_AMBULATORY_CARE_PROVIDER_SITE_OTHER): Payer: Self-pay | Admitting: General Surgery

## 2011-12-30 ENCOUNTER — Encounter (INDEPENDENT_AMBULATORY_CARE_PROVIDER_SITE_OTHER): Payer: Self-pay | Admitting: General Surgery

## 2011-12-30 ENCOUNTER — Ambulatory Visit (INDEPENDENT_AMBULATORY_CARE_PROVIDER_SITE_OTHER): Payer: BC Managed Care – PPO | Admitting: General Surgery

## 2011-12-30 VITALS — BP 132/96 | HR 88 | Temp 98.6°F | Resp 18 | Ht 66.0 in | Wt 199.2 lb

## 2011-12-30 DIAGNOSIS — R1011 Right upper quadrant pain: Secondary | ICD-10-CM

## 2011-12-30 NOTE — Patient Instructions (Signed)

## 2011-12-30 NOTE — Progress Notes (Signed)
Patient ID: EH SAUSEDA, female   DOB: October 18, 1961, 51 y.o.   MRN: [id number]  Chief Complaint  Patient presents with  . Other    new pt- eval GB    HPI Shari Ruiz is a 51 y.o. female.   HPI  51 year old [place] American female referred by Dr Oneta Rack for abdominal pain. The patient states she has had ongoing problems for about a year. She describes her discomfort as right sided radiating to her upper abdomen. She describes it as a dull ache and a pressure sensation. It generally occurs everyday. She also states she has had some belching & intermittent episodes of diarrhea. The discomfort generally happens once or twice a day usually after eating. She states she did not use to have a problem with heartburn; but lately she has developed heartburn. She also complains of discomfort between her shoulder blades.  She denies any dysphagia or any painful swallowing.  She states she has had every test to try to figure the etiology of pain. She states she had an ultrasound which was negative. She also states she has had a CT, HIDA scan, and EGD.  She denies any NSAID use, steatorrhea, melena, hematochezia, or acholic stools.  She states her mother and aunts have all had their gallbladders removed and they had similar symptoms.   Past Medical History  Diagnosis Date  . Depression   . Hypertension   . Arthritis   . Pneumonia   . Urinary tract infection   . Breast fibroadenoma     right    Past Surgical History  Procedure Date  . Abdominal hysterectomy   . Breast lumpectomy     Family History  Problem Relation Age of Onset  . Cancer Maternal Aunt     breast and liver  . Heart disease Maternal Uncle   . Cancer Maternal Grandmother     colon    Social History History  Substance Use Topics  . Smoking status: Former Games developer  . Smokeless tobacco: Not on file  . Alcohol Use: Yes    No Known Allergies  Current Outpatient Prescriptions  Medication Sig Dispense Refill  .  diphenhydrAMINE (SOMINEX) 25 MG tablet Take 25 mg by mouth at bedtime as needed.        Marland Kitchen lisinopril-hydrochlorothiazide (PRINZIDE,ZESTORETIC) 10-12.5 MG per tablet Take 1 tablet by mouth daily.        . peg 3350 electrolyte powder (MOVIPREP) 100 G SOLR Take 1 kit by mouth once.        . ranitidine (ZANTAC) 150 MG tablet Take 150 mg by mouth 2 (two) times daily.          Review of Systems Review of Systems  Constitutional: Negative for fever, activity change, fatigue and unexpected weight change (pt has lost 26 pounds which has been planned).       Does Zumba once a week for 45 minutes  HENT: Negative for hearing loss and neck stiffness.   Eyes: Negative for photophobia and visual disturbance.  Respiratory: Negative for chest tightness and shortness of breath.   Cardiovascular: Negative for chest pain and palpitations. Leg swelling: some occassional.  Gastrointestinal:       See HPI   Genitourinary: Negative for dysuria, hematuria and difficulty urinating.  Musculoskeletal: Negative for arthralgias and gait problem.  Skin: Negative for color change and pallor.  Neurological: Negative for tremors, seizures, speech difficulty and light-headedness.  Hematological: Negative.   Psychiatric/Behavioral: The patient is not nervous/anxious.  Blood pressure 132/96, pulse 88, temperature 98.6 F (37 C), temperature source Temporal, resp. rate 18, height 5\' 6"  (1.676 m), weight 199 lb 3.2 oz (90.357 kg).  Physical Exam Physical Exam  Vitals reviewed. Constitutional: She is oriented to person, place, and time. She appears well-developed and well-nourished. No distress.  HENT:  Head: Normocephalic and atraumatic.  Eyes: Conjunctivae are normal. No scleral icterus.  Neck: Normal range of motion. Neck supple. No tracheal deviation present. No thyromegaly present.  Cardiovascular: Normal rate, regular rhythm and normal heart sounds.   Pulmonary/Chest: Effort normal and breath sounds normal. No  respiratory distress. She has no wheezes.  Abdominal: Soft. Bowel sounds are normal. She exhibits no distension. There is no tenderness. There is no rebound.  Musculoskeletal: Normal range of motion. She exhibits no edema and no tenderness.  Neurological: She is alert and oriented to person, place, and time. She exhibits normal muscle tone.  Skin: Skin is warm and dry. No rash noted. No erythema.  Psychiatric: She has a normal mood and affect. Her behavior is normal. Thought content normal.    Data Reviewed EGD and colonoscopy from March 2012 CT scan - negative except for hepatic cysts HIDA - normal  Assessment    RUQ pain    Plan    Some of her symptoms can be and are consistent with gallbladder disease.    Since she has had a negative EGD, I'm more willing to offer her a cholecystectomy.  We discussed gallbladder disease. The patient was given Agricultural engineer. We discussed non-operative and operative management.   I discussed laparoscopic cholecystectomy with IOC in detail.  The patient was given educational material as well as diagrams detailing the procedure.  We discussed the risks and benefits of a laparoscopic cholecystectomy including, but not limited to bleeding, infection, injury to surrounding structures such as the intestine or liver, bile leak, retained gallstones, need to convert to an open procedure, prolonged diarrhea, blood clots such as  DVT, common bile duct injury, anesthesia risks, and possible need for additional procedures.  We discussed the typical post-operative recovery course. I explained that the likelihood of improvement of their symptoms is fair. We discussed that cholecystectomy may not ameliorate her abdominal complaints.  I do not believe her gallbladder disease is related to her hoarseness.  I have recommended she speak with her PCP about a referral to ENT surgeon.   Mary Sella. Andrey Campanile, MD, FACS General, Bariatric, & Minimally Invasive  Surgery [facility], [place]         [facility] M 12/30/2011, 3:17 PM

## 2012-01-01 ENCOUNTER — Encounter (HOSPITAL_COMMUNITY): Payer: Self-pay | Admitting: Pharmacy Technician

## 2012-01-06 NOTE — Pre-Procedure Instructions (Signed)
19 Shari Ruiz  01/06/2012   Your procedure is scheduled on:  Thursday, JAN. 31 st  Report to [facility] at 10:00 AM.  Call this number if you have problems the morning of surgery: [phone]   Remember:   Do not eat food:After Midnight Wednesday.  May have clear liquids: up to 4 Hours before arrival time--             Up until 6:00AM.  Clear liquids include soda, tea, black coffee, apple or grape juice, broth.   Take these medicines the morning of surgery with A SIP OF WATER: Pepcid   Do not wear jewelry, make-up or nail polish.  Do not wear lotions, powders, or perfumes. You may wear deodorant.   Do not shave 48 hours prior to surgery.   Do not bring valuables to the hospital.  Contacts, dentures or bridgework may not be worn into surgery.  Leave suitcase in the car. After surgery it may be brought to your room.  For patients admitted to the hospital, checkout time is 11:00 AM the day of discharge.   Patients discharged the day of surgery will not be allowed to drive home.  Name and phone number of your driver:  DANIEL  Tew --  SPOUSE  Special Instructions: CHG Shower Use Special Wash: 1/2 bottle night before surgery and 1/2 bottle morning of surgery.   Please read over the following fact sheets that you were given: Pain Booklet, MRSA Information and Surgical Site Infection Prevention

## 2012-01-07 ENCOUNTER — Encounter (HOSPITAL_COMMUNITY): Payer: Self-pay

## 2012-01-07 ENCOUNTER — Encounter (HOSPITAL_COMMUNITY)
Admission: RE | Admit: 2012-01-07 | Discharge: 2012-01-07 | Disposition: A | Discharge status: Home / Self Care | Payer: BC Managed Care – PPO | Source: Ambulatory Visit | Attending: General Surgery | Admitting: General Surgery

## 2012-01-07 ENCOUNTER — Ambulatory Visit (HOSPITAL_COMMUNITY)
Admission: RE | Admit: 2012-01-07 | Discharge: 2012-01-07 | Disposition: A | Discharge status: Home / Self Care | Payer: BC Managed Care – PPO | Source: Ambulatory Visit | Attending: Anesthesiology | Admitting: Anesthesiology

## 2012-01-07 HISTORY — DX: Gastro-esophageal reflux disease without esophagitis: K21.9

## 2012-01-07 HISTORY — DX: Cardiac arrhythmia, unspecified: I49.9

## 2012-01-07 LAB — COMPREHENSIVE METABOLIC PANEL
AST: 15 U/L (ref 0–37)
Albumin: 4 g/dL (ref 3.5–5.2)
BUN: 15 mg/dL (ref 6–23)
Creatinine, Ser: 1.07 mg/dL (ref 0.50–1.10)
Total Protein: 6.9 g/dL (ref 6.0–8.3)

## 2012-01-07 LAB — DIFFERENTIAL
Lymphocytes Relative: 22 % (ref 12–46)
Lymphs Abs: 2.4 10*3/uL (ref 0.7–4.0)
Monocytes Relative: 9 % (ref 3–12)
Neutro Abs: 7.5 10*3/uL (ref 1.7–7.7)
Neutrophils Relative %: 68 % (ref 43–77)

## 2012-01-07 LAB — CBC
HCT: 37.7 % (ref 36.0–46.0)
MCHC: 35.3 g/dL (ref 30.0–36.0)
MCV: 86.7 fL (ref 78.0–100.0)
Platelets: 277 10*3/uL (ref 150–400)
RDW: 13.2 % (ref 11.5–15.5)

## 2012-01-07 LAB — SURGICAL PCR SCREEN: Staphylococcus aureus: NEGATIVE

## 2012-01-07 MED ORDER — CHLORHEXIDINE GLUCONATE 4 % EX LIQD: 1.0000 "application " | Freq: Once | CUTANEOUS | Status: DC

## 2012-01-07 NOTE — Pre-Procedure Instructions (Signed)
Shari Ruiz  01/07/2012   Your procedure is scheduled on:  Thursday, Jan 31 st*  Report to [facility] at 10:00 AM.  Call this number if you have problems the morning of surgery: [phone]   Remember:   Do not eat food:After Midnight  Wednesday.  May have clear liquids: up to 4 Hours before arrival time..6:00 AM.   Clear liquids include soda, tea, black coffee, apple or grape juice, broth.   Take these medicines the morning of surgery with A SIP OF WATER:  PEPCID*   Do not wear jewelry, make-up or nail polish.   Do not wear lotions, powders, or perfumes. You may wear deodorant.   Do not shave 48 hours prior to surgery.  Do not bring valuables to the hospital.  Contacts, dentures or bridgework may not be worn into surgery.  Leave suitcase in the car. After surgery it may be brought to your room.  For patients admitted to the hospital, checkout time is 11:00 AM the day of discharge.   Patients discharged the day of surgery will not be allowed to drive home.  Name and phone number of your driver: [name]  Special Instructions: CHG Shower Use Special Wash: 1/2 bottle night before surgery and 1/2 bottle morning of surgery.   Please read over the following fact sheets that you were given: Pain Booklet, MRSA Information and Surgical Site Infection Prevention

## 2012-01-07 NOTE — Progress Notes (Signed)
HAD  EKG  DONE AT HER  PCP.Marland Kitchen AM REQUESTING IT TODAY~~~

## 2012-01-08 MED ORDER — DEXTROSE 5 % IV SOLN
2.0000 g | INTRAVENOUS | Status: AC
  Administered 2012-01-09: 2 g via INTRAVENOUS
  Filled 2012-01-08: qty 2

## 2012-01-09 ENCOUNTER — Other Ambulatory Visit (INDEPENDENT_AMBULATORY_CARE_PROVIDER_SITE_OTHER): Payer: Self-pay | Admitting: General Surgery

## 2012-01-09 ENCOUNTER — Encounter (HOSPITAL_COMMUNITY)
Admission: RE | Disposition: A | Discharge status: Home / Self Care | Payer: Self-pay | Source: Ambulatory Visit | Attending: General Surgery

## 2012-01-09 ENCOUNTER — Encounter (HOSPITAL_COMMUNITY): Payer: Self-pay | Admitting: Certified Registered"

## 2012-01-09 ENCOUNTER — Ambulatory Visit (HOSPITAL_COMMUNITY)
Admission: RE | Admit: 2012-01-09 | Discharge: 2012-01-09 | Disposition: A | Discharge status: Home / Self Care | Payer: BC Managed Care – PPO | Source: Ambulatory Visit | Attending: General Surgery | Admitting: General Surgery

## 2012-01-09 ENCOUNTER — Encounter (HOSPITAL_COMMUNITY): Payer: Self-pay | Admitting: *Deleted

## 2012-01-09 ENCOUNTER — Ambulatory Visit (HOSPITAL_COMMUNITY): Payer: BC Managed Care – PPO | Admitting: Certified Registered"

## 2012-01-09 DIAGNOSIS — Z79899 Other long term (current) drug therapy: Secondary | ICD-10-CM | POA: Insufficient documentation

## 2012-01-09 DIAGNOSIS — R490 Dysphonia: Secondary | ICD-10-CM | POA: Insufficient documentation

## 2012-01-09 DIAGNOSIS — K811 Chronic cholecystitis: Secondary | ICD-10-CM

## 2012-01-09 DIAGNOSIS — Z01818 Encounter for other preprocedural examination: Secondary | ICD-10-CM | POA: Insufficient documentation

## 2012-01-09 DIAGNOSIS — F329 Major depressive disorder, single episode, unspecified: Secondary | ICD-10-CM | POA: Insufficient documentation

## 2012-01-09 DIAGNOSIS — R1011 Right upper quadrant pain: Secondary | ICD-10-CM | POA: Insufficient documentation

## 2012-01-09 DIAGNOSIS — F3289 Other specified depressive episodes: Secondary | ICD-10-CM | POA: Insufficient documentation

## 2012-01-09 DIAGNOSIS — M129 Arthropathy, unspecified: Secondary | ICD-10-CM | POA: Insufficient documentation

## 2012-01-09 DIAGNOSIS — I10 Essential (primary) hypertension: Secondary | ICD-10-CM | POA: Insufficient documentation

## 2012-01-09 DIAGNOSIS — K824 Cholesterolosis of gallbladder: Secondary | ICD-10-CM

## 2012-01-09 DIAGNOSIS — Z01810 Encounter for preprocedural cardiovascular examination: Secondary | ICD-10-CM | POA: Insufficient documentation

## 2012-01-09 HISTORY — PX: CHOLECYSTECTOMY: SHX55

## 2012-01-09 SURGERY — LAPAROSCOPIC CHOLECYSTECTOMY
Anesthesia: General | Site: Abdomen | Wound class: Contaminated

## 2012-01-09 MED ORDER — LACTATED RINGERS IV SOLN
INTRAVENOUS | Status: DC
  Administered 2012-01-09 (×2): via INTRAVENOUS

## 2012-01-09 MED ORDER — ONDANSETRON HCL 4 MG/2ML IJ SOLN
INTRAMUSCULAR | Status: DC | PRN
  Administered 2012-01-09: 4 mg via INTRAVENOUS

## 2012-01-09 MED ORDER — SODIUM CHLORIDE 0.9 % IR SOLN
Status: DC | PRN
  Administered 2012-01-09: 1

## 2012-01-09 MED ORDER — DROPERIDOL 2.5 MG/ML IJ SOLN
INTRAMUSCULAR | Status: AC
  Filled 2012-01-09: qty 2

## 2012-01-09 MED ORDER — MIDAZOLAM HCL 5 MG/5ML IJ SOLN
INTRAMUSCULAR | Status: DC | PRN
  Administered 2012-01-09: 2 mg via INTRAVENOUS

## 2012-01-09 MED ORDER — DEXAMETHASONE SODIUM PHOSPHATE 4 MG/ML IJ SOLN
INTRAMUSCULAR | Status: DC | PRN
  Administered 2012-01-09: 4 mg via INTRAVENOUS

## 2012-01-09 MED ORDER — 0.9 % SODIUM CHLORIDE (POUR BTL) OPTIME
TOPICAL | Status: DC | PRN
  Administered 2012-01-09: 1000 mL

## 2012-01-09 MED ORDER — MORPHINE SULFATE 4 MG/ML IJ SOLN
INTRAMUSCULAR | Status: AC
  Administered 2012-01-09: 2 mg
  Filled 2012-01-09: qty 1

## 2012-01-09 MED ORDER — NEOSTIGMINE METHYLSULFATE 1 MG/ML IJ SOLN
INTRAMUSCULAR | Status: DC | PRN
  Administered 2012-01-09: 3 mg via INTRAVENOUS

## 2012-01-09 MED ORDER — HYDROMORPHONE HCL PF 1 MG/ML IJ SOLN
INTRAMUSCULAR | Status: AC
  Filled 2012-01-09: qty 1

## 2012-01-09 MED ORDER — DROPERIDOL 2.5 MG/ML IJ SOLN
0.6250 mg | INTRAMUSCULAR | Status: DC | PRN
Start: 2012-01-09 — End: 2012-01-09
  Administered 2012-01-09: 0.625 mg via INTRAVENOUS

## 2012-01-09 MED ORDER — MORPHINE SULFATE 2 MG/ML IJ SOLN: 1.0000 mg | INTRAMUSCULAR | Status: DC | PRN

## 2012-01-09 MED ORDER — ROCURONIUM BROMIDE 100 MG/10ML IV SOLN
INTRAVENOUS | Status: DC | PRN
  Administered 2012-01-09: 50 mg via INTRAVENOUS

## 2012-01-09 MED ORDER — GLYCOPYRROLATE 0.2 MG/ML IJ SOLN
INTRAMUSCULAR | Status: DC | PRN
  Administered 2012-01-09: 0.2 mg via INTRAVENOUS

## 2012-01-09 MED ORDER — BUPIVACAINE-EPINEPHRINE PF 0.5-1:200000 % IJ SOLN
INTRAMUSCULAR | Status: DC | PRN
  Administered 2012-01-09: 30 mL

## 2012-01-09 MED ORDER — PROPOFOL 10 MG/ML IV EMUL
INTRAVENOUS | Status: DC | PRN
  Administered 2012-01-09: 200 mg via INTRAVENOUS

## 2012-01-09 MED ORDER — HYDROMORPHONE HCL PF 1 MG/ML IJ SOLN
0.2500 mg | INTRAMUSCULAR | Status: DC | PRN
  Administered 2012-01-09: 0.25 mg via INTRAVENOUS
  Administered 2012-01-09 (×2): 0.5 mg via INTRAVENOUS

## 2012-01-09 MED ORDER — OXYCODONE-ACETAMINOPHEN 5-325 MG PO TABS: 1.0000 | ORAL_TABLET | ORAL | Status: DC | PRN

## 2012-01-09 MED ORDER — ONDANSETRON HCL 4 MG/2ML IJ SOLN: 4.0000 mg | INTRAMUSCULAR | Status: DC | PRN

## 2012-01-09 MED ORDER — OXYCODONE-ACETAMINOPHEN 5-325 MG PO TABS
1.0000 | ORAL_TABLET | ORAL | Status: AC | PRN
Start: 2012-01-09 — End: 2012-01-19

## 2012-01-09 MED ORDER — FENTANYL CITRATE 0.05 MG/ML IJ SOLN
INTRAMUSCULAR | Status: DC | PRN
  Administered 2012-01-09: 100 ug via INTRAVENOUS
  Administered 2012-01-09 (×3): 50 ug via INTRAVENOUS
  Administered 2012-01-09: 25 ug via INTRAVENOUS
  Administered 2012-01-09 (×2): 50 ug via INTRAVENOUS

## 2012-01-09 SURGICAL SUPPLY — 51 items
APPLIER CLIP 5 13 M/L LIGAMAX5 (MISCELLANEOUS) ×3
BANDAGE ADHESIVE 1X3 (GAUZE/BANDAGES/DRESSINGS) IMPLANT
BENZOIN TINCTURE PRP APPL 2/3 (GAUZE/BANDAGES/DRESSINGS) IMPLANT
BLADE SURG ROTATE 9660 (MISCELLANEOUS) IMPLANT
CANISTER SUCTION 2500CC (MISCELLANEOUS) ×3 IMPLANT
CHLORAPREP W/TINT 26ML (MISCELLANEOUS) ×3 IMPLANT
CLIP APPLIE 5 13 M/L LIGAMAX5 (MISCELLANEOUS) ×2 IMPLANT
CLOTH BEACON ORANGE TIMEOUT ST (SAFETY) ×3 IMPLANT
COVER MAYO STAND STRL (DRAPES) IMPLANT
COVER SURGICAL LIGHT HANDLE (MISCELLANEOUS) ×3 IMPLANT
DECANTER SPIKE VIAL GLASS SM (MISCELLANEOUS) ×3 IMPLANT
DRAPE C-ARM 42X72 X-RAY (DRAPES) IMPLANT
DRAPE UTILITY 15X26 W/TAPE STR (DRAPE) ×6 IMPLANT
DRSG TEGADERM 2-3/8X2-3/4 SM (GAUZE/BANDAGES/DRESSINGS) ×3 IMPLANT
DRSG TEGADERM 4X4.75 (GAUZE/BANDAGES/DRESSINGS) ×3 IMPLANT
ELECT REM PT RETURN 9FT ADLT (ELECTROSURGICAL) ×3
ELECTRODE REM PT RTRN 9FT ADLT (ELECTROSURGICAL) ×2 IMPLANT
GAUZE SPONGE 2X2 8PLY STRL LF (GAUZE/BANDAGES/DRESSINGS) IMPLANT
GLOVE BIO SURGEON STRL SZ7.5 (GLOVE) ×6 IMPLANT
GLOVE BIOGEL M STRL SZ7.5 (GLOVE) ×3 IMPLANT
GLOVE BIOGEL PI IND STRL 7.0 (GLOVE) ×6 IMPLANT
GLOVE BIOGEL PI IND STRL 7.5 (GLOVE) ×2 IMPLANT
GLOVE BIOGEL PI IND STRL 8 (GLOVE) ×2 IMPLANT
GLOVE BIOGEL PI INDICATOR 7.0 (GLOVE) ×3
GLOVE BIOGEL PI INDICATOR 7.5 (GLOVE) ×1
GLOVE BIOGEL PI INDICATOR 8 (GLOVE) ×1
GLOVE ECLIPSE 6.5 STRL STRAW (GLOVE) ×3 IMPLANT
GLOVE SURG SS PI 6.5 STRL IVOR (GLOVE) ×3 IMPLANT
GLOVE SURG SS PI 7.0 STRL IVOR (GLOVE) ×3 IMPLANT
GOWN STRL NON-REIN LRG LVL3 (GOWN DISPOSABLE) ×12 IMPLANT
GOWN STRL REIN XL XLG (GOWN DISPOSABLE) ×3 IMPLANT
KIT BASIN OR (CUSTOM PROCEDURE TRAY) ×3 IMPLANT
KIT ROOM TURNOVER OR (KITS) ×3 IMPLANT
NS IRRIG 1000ML POUR BTL (IV SOLUTION) ×3 IMPLANT
PAD ARMBOARD 7.5X6 YLW CONV (MISCELLANEOUS) ×6 IMPLANT
POUCH SPECIMEN RETRIEVAL 10MM (ENDOMECHANICALS) ×3 IMPLANT
SCISSORS LAP 5X35 DISP (ENDOMECHANICALS) IMPLANT
SET CHOLANGIOGRAPH 5 50 .035 (SET/KITS/TRAYS/PACK) IMPLANT
SET IRRIG TUBING LAPAROSCOPIC (IRRIGATION / IRRIGATOR) ×3 IMPLANT
SLEEVE ENDOPATH XCEL 5M (ENDOMECHANICALS) ×6 IMPLANT
SPECIMEN JAR SMALL (MISCELLANEOUS) ×3 IMPLANT
SPONGE GAUZE 2X2 STER 10/PKG (GAUZE/BANDAGES/DRESSINGS)
STRIP CLOSURE SKIN 1/2X4 (GAUZE/BANDAGES/DRESSINGS) ×3 IMPLANT
SUT MNCRL AB 4-0 PS2 18 (SUTURE) ×3 IMPLANT
SUT VICRYL 0 UR6 27IN ABS (SUTURE) IMPLANT
TOWEL OR 17X24 6PK STRL BLUE (TOWEL DISPOSABLE) ×3 IMPLANT
TOWEL OR 17X26 10 PK STRL BLUE (TOWEL DISPOSABLE) ×3 IMPLANT
TRAY LAPAROSCOPIC (CUSTOM PROCEDURE TRAY) ×3 IMPLANT
TROCAR XCEL BLUNT TIP 100MML (ENDOMECHANICALS) ×3 IMPLANT
TROCAR XCEL NON-BLD 5MMX100MML (ENDOMECHANICALS) ×3 IMPLANT
WATER STERILE IRR 1000ML POUR (IV SOLUTION) IMPLANT

## 2012-01-09 NOTE — Transfer of Care (Signed)
Immediate Anesthesia Transfer of Care Note  Patient: Shari Ruiz  Procedure(s) Performed:  LAPAROSCOPIC CHOLECYSTECTOMY - Laparoscopic cholecystectomy with attempted intraoperative cholangiogram  Patient Location: PACU  Anesthesia Type: General  Level of Consciousness: awake, alert  and oriented  Airway & Oxygen Therapy: Patient Spontanous Breathing and Patient connected to face mask oxygen  Post-op Assessment: Report given to PACU RN and Post -op Vital signs reviewed and stable  Post vital signs: Reviewed and stable  Complications: No apparent anesthesia complications

## 2012-01-09 NOTE — Anesthesia Preprocedure Evaluation (Signed)
Anesthesia Evaluation  Patient identified by MRN, date of birth, ID band Patient awake    Reviewed: Allergy & Precautions, H&P , NPO status , Patient's Chart, lab work & pertinent test results, reviewed documented beta blocker date and time   History of Anesthesia Complications Negative for: history of anesthetic complications  Airway       Dental   Pulmonary    Pulmonary exam normal       Cardiovascular hypertension, Pt. on medications - dysrhythmias Regular Normal- Systolic murmurs PVC's    Neuro/Psych Negative Neurological ROS  Negative Psych ROS   GI/Hepatic Neg liver ROS, GERD-  Poorly Controlled,  Endo/Other  Negative Endocrine ROS  Renal/GU negative Renal ROS     Musculoskeletal   Abdominal   Peds  Hematology   Anesthesia Other Findings   Reproductive/Obstetrics                           Anesthesia Physical Anesthesia Plan  ASA: II  Anesthesia Plan: General   Post-op Pain Management:    Induction: Rapid sequence and Intravenous  Airway Management Planned: Oral ETT  Additional Equipment:   Intra-op Plan:   Post-operative Plan:   Informed Consent: I have reviewed the patients History and Physical, chart, labs and discussed the procedure including the risks, benefits and alternatives for the proposed anesthesia with the patient or authorized representative who has indicated his/her understanding and acceptance.     Plan Discussed with: CRNA, Anesthesiologist and Surgeon  Anesthesia Plan Comments:         Anesthesia Quick Evaluation

## 2012-01-09 NOTE — Anesthesia Postprocedure Evaluation (Signed)
Anesthesia Post Note  Patient: Shari Ruiz  Procedure(s) Performed:  LAPAROSCOPIC CHOLECYSTECTOMY - Laparoscopic cholecystectomy with attempted intraoperative cholangiogram  Anesthesia type: general  Patient location: PACU  Post pain: Pain level controlled  Post assessment: Patient's Cardiovascular Status Stable  Last Vitals:  Filed Vitals:   01/09/12 1400  BP:   Pulse: 70  Temp:   Resp: 18    Post vital signs: Reviewed and stable  Level of consciousness: sedated  Complications: No apparent anesthesia complications

## 2012-01-09 NOTE — H&P (View-Only) (Signed)
Patient ID: Shari Ruiz, female   DOB: 08/13/1961, 51 y.o.   MRN: [id number]  Chief Complaint  Patient presents with  . Other    new pt- eval GB    HPI Shari Ruiz is a 51 y.o. female.   HPI  51-year-old African American female referred by Dr McKeown for abdominal pain. The patient states she has had ongoing problems for about a year. She describes her discomfort as right sided radiating to her upper abdomen. She describes it as a dull ache and a pressure sensation. It generally occurs everyday. She also states she has had some belching & intermittent episodes of diarrhea. The discomfort generally happens once or twice a day usually after eating. She states she did not use to have a problem with heartburn; but lately she has developed heartburn. She also complains of discomfort between her shoulder blades.  She denies any dysphagia or any painful swallowing.  She states she has had every test to try to figure the etiology of pain. She states she had an ultrasound which was negative. She also states she has had a CT, HIDA scan, and EGD.  She denies any NSAID use, steatorrhea, melena, hematochezia, or acholic stools.  She states her mother and aunts have all had their gallbladders removed and they had similar symptoms.   Past Medical History  Diagnosis Date  . Depression   . Hypertension   . Arthritis   . Pneumonia   . Urinary tract infection   . Breast fibroadenoma     right    Past Surgical History  Procedure Date  . Abdominal hysterectomy   . Breast lumpectomy     Family History  Problem Relation Age of Onset  . Cancer Maternal Aunt     breast and liver  . Heart disease Maternal Uncle   . Cancer Maternal Grandmother     colon    Social History History  Substance Use Topics  . Smoking status: Former Smoker  . Smokeless tobacco: Not on file  . Alcohol Use: Yes    No Known Allergies  Current Outpatient Prescriptions  Medication Sig Dispense Refill  .  diphenhydrAMINE (SOMINEX) 25 MG tablet Take 25 mg by mouth at bedtime as needed.        . lisinopril-hydrochlorothiazide (PRINZIDE,ZESTORETIC) 10-12.5 MG per tablet Take 1 tablet by mouth daily.        . peg 3350 electrolyte powder (MOVIPREP) 100 G SOLR Take 1 kit by mouth once.        . ranitidine (ZANTAC) 150 MG tablet Take 150 mg by mouth 2 (two) times daily.          Review of Systems Review of Systems  Constitutional: Negative for fever, activity change, fatigue and unexpected weight change (pt has lost 26 pounds which has been planned).       Does Zumba once a week for 45 minutes  HENT: Negative for hearing loss and neck stiffness.   Eyes: Negative for photophobia and visual disturbance.  Respiratory: Negative for chest tightness and shortness of breath.   Cardiovascular: Negative for chest pain and palpitations. Leg swelling: some occassional.  Gastrointestinal:       See HPI   Genitourinary: Negative for dysuria, hematuria and difficulty urinating.  Musculoskeletal: Negative for arthralgias and gait problem.  Skin: Negative for color change and pallor.  Neurological: Negative for tremors, seizures, speech difficulty and light-headedness.  Hematological: Negative.   Psychiatric/Behavioral: The patient is not nervous/anxious.       Blood pressure 132/96, pulse 88, temperature 98.6 F (37 C), temperature source Temporal, resp. rate 18, height 5' 6" (1.676 m), weight 199 lb 3.2 oz (90.357 kg).  Physical Exam Physical Exam  Vitals reviewed. Constitutional: She is oriented to person, place, and time. She appears well-developed and well-nourished. No distress.  HENT:  Head: Normocephalic and atraumatic.  Eyes: Conjunctivae are normal. No scleral icterus.  Neck: Normal range of motion. Neck supple. No tracheal deviation present. No thyromegaly present.  Cardiovascular: Normal rate, regular rhythm and normal heart sounds.   Pulmonary/Chest: Effort normal and breath sounds normal. No  respiratory distress. She has no wheezes.  Abdominal: Soft. Bowel sounds are normal. She exhibits no distension. There is no tenderness. There is no rebound.  Musculoskeletal: Normal range of motion. She exhibits no edema and no tenderness.  Neurological: She is alert and oriented to person, place, and time. She exhibits normal muscle tone.  Skin: Skin is warm and dry. No rash noted. No erythema.  Psychiatric: She has a normal mood and affect. Her behavior is normal. Thought content normal.    Data Reviewed EGD and colonoscopy from March 2012 CT scan - negative except for hepatic cysts HIDA - normal  Assessment    RUQ pain    Plan    Some of her symptoms can be and are consistent with gallbladder disease.    Since she has had a negative EGD, I'm more willing to offer her a cholecystectomy.  We discussed gallbladder disease. The patient was given educational material. We discussed non-operative and operative management.   I discussed laparoscopic cholecystectomy with IOC in detail.  The patient was given educational material as well as diagrams detailing the procedure.  We discussed the risks and benefits of a laparoscopic cholecystectomy including, but not limited to bleeding, infection, injury to surrounding structures such as the intestine or liver, bile leak, retained gallstones, need to convert to an open procedure, prolonged diarrhea, blood clots such as  DVT, common bile duct injury, anesthesia risks, and possible need for additional procedures.  We discussed the typical post-operative recovery course. I explained that the likelihood of improvement of their symptoms is fair. We discussed that cholecystectomy may not ameliorate her abdominal complaints.  I do not believe her gallbladder disease is related to her hoarseness.  I have recommended she speak with her PCP about a referral to ENT surgeon.   Tytiana Coles M. Rosario Duey, MD, FACS General, Bariatric, & Minimally Invasive  Surgery [facility]         Nyssa Sayegh M 12/30/2011, 3:17 PM    

## 2012-01-09 NOTE — Interval H&P Note (Signed)
History and Physical Interval Note:  01/09/2012 11:30 AM  Shari Ruiz  has presented today for surgery, with the diagnosis of right upper quadrant pain  The various methods of treatment have been discussed with the patient and family. After consideration of risks, benefits and other options for treatment, the patient has consented to  Procedure(s): LAPAROSCOPIC CHOLECYSTECTOMY WITH INTRAOPERATIVE CHOLANGIOGRAM as a surgical intervention .  The patients' history has been reviewed, patient examined, no change in status, stable for surgery.  I have reviewed the patients' chart and labs.  Questions were answered to the patient's satisfaction.    Mary Sella. Andrey Campanile, MD, FACS General, Bariatric, & Minimally Invasive Surgery [facility], [place]   [facility] M

## 2012-01-09 NOTE — Op Note (Signed)
Laparoscopic Cholecystectomy Procedure Note  Indications: This patient presents with right upper quadrant pain and will undergo laparoscopic cholecystectomy.  Pre-operative Diagnosis: Abdominal pain, right upper quadrant  Post-operative Diagnosis: Same  Surgeon: Atilano Ina   Assistants: none  Anesthesia: General endotracheal anesthesia and Local anesthesia 0.5% bupivacaine, with epinephrine  ASA Class: 2  Procedure Details  The patient was seen again in the Holding Room. The risks, benefits, complications, treatment options, and expected outcomes were discussed with the patient. The possibilities of reaction to medication, pulmonary aspiration, perforation of viscus, bleeding, recurrent infection, finding a normal gallbladder, the need for additional procedures, failure to diagnose a condition, the possible need to convert to an open procedure, and creating a complication requiring transfusion or operation were discussed with the patient. The likelihood of improving the patient's symptoms with return to their baseline status is fair.  The patient and/or family concurred with the proposed plan, giving informed consent. The site of surgery properly noted. The patient was taken to Operating Room, identified as AMBERLEIGH GERKEN and the procedure verified as Laparoscopic Cholecystectomy with Intraoperative Cholangiogram. A Time Out was held and the above information confirmed.  Prior to the induction of general anesthesia, antibiotic prophylaxis was administered. General endotracheal anesthesia was then administered and tolerated well. After the induction, the abdomen was prepped with Chloraprep and draped in the sterile fashion. The patient was positioned in the supine position.  Local anesthetic agent was injected into the skin near the umbilicus and an incision made. We dissected down to the abdominal fascia with blunt dissection.  The fascia was incised vertically and we entered the peritoneal  cavity bluntly.  A pursestring suture of 0-Vicryl was placed around the fascial opening.  The Hasson cannula was inserted and secured with the stay suture.  Pneumoperitoneum was then created with CO2 and tolerated well without any adverse changes in the patient's vital signs. An 5-mm port was placed in the subxiphoid position.  Two 5-mm ports were placed in the right upper quadrant. All skin incisions were infiltrated with a local anesthetic agent before making the incision and placing the trocars.   We positioned the patient in reverse Trendelenburg, tilted slightly to the patient's left.  The gallbladder was identified, the fundus grasped and retracted cephalad. Adhesions were lysed bluntly and with the electrocautery where indicated, taking care not to injure any adjacent organs or viscus. The infundibulum was grasped and retracted laterally, exposing the peritoneum overlying the triangle of Calot. This was then divided and exposed in a blunt fashion. A critical view of the cystic duct and cystic artery was obtained.  The cystic duct was clearly identified and bluntly dissected circumferentially. The cystic duct was ligated with a clip distally.   An incision was made in the cystic duct and the [facility] cholangiogram catheter was advanced to the cystic duct opening. However, despite manuerving the catheter for several minutes, I was not able to cannulate the duct. Her LFTS were normal, there was no history of gallstones on imaging, and her CBD was normal on preop imaging; therefore, I aborted the cholangiogram since I could not cannulate the cystic duct.    The cystic duct was then ligated with clips and divided. The cystic artery was identified, dissected free, ligated with clips and divided as well.   The gallbladder was dissected from the liver bed in retrograde fashion with the electrocautery. The gallbladder was removed and placed in an Endocatch sac. The liver bed was irrigated and inspected. Hemostasis  was  achieved with the electrocautery. Copious irrigation was utilized and was repeatedly aspirated until clear.  The gallbladder and Endocatch sac were then removed through the umbilical port site.  The pursestring suture was used to close the umbilical fascia.    We again inspected the right upper quadrant for hemostasis.  The umbilical closure was inspected and there was nothing within the closure and no air leak. There were a few omental adhesions in the lower midline which I elected to leave alone. Pneumoperitoneum was released as we removed the trocars.  4-0 Monocryl was used to close the skin.   Benzoin, steri-strips, and clean dressings were applied. The patient was then extubated and brought to the recovery room in stable condition. Instrument, sponge, and needle counts were correct at closure and at the conclusion of the case.   Findings: Some omental adhesions. +critical view  Estimated Blood Loss: Minimal         Drains: none         Specimens: Gallbladder           Complications: None; patient tolerated the procedure well.         Disposition: PACU - hemodynamically stable.         Condition: stable  Mary Sella. Andrey Campanile, MD, FACS General, Bariatric, & Minimally Invasive Surgery [facility], [place]

## 2012-01-10 ENCOUNTER — Encounter (HOSPITAL_COMMUNITY): Payer: Self-pay | Admitting: General Surgery

## 2012-01-29 ENCOUNTER — Ambulatory Visit (INDEPENDENT_AMBULATORY_CARE_PROVIDER_SITE_OTHER): Payer: BC Managed Care – PPO | Admitting: General Surgery

## 2012-01-29 ENCOUNTER — Encounter (INDEPENDENT_AMBULATORY_CARE_PROVIDER_SITE_OTHER): Payer: Self-pay | Admitting: General Surgery

## 2012-01-29 VITALS — BP 120/76 | HR 100 | Resp 16 | Ht 66.0 in | Wt 197.0 lb

## 2012-01-29 DIAGNOSIS — Z09 Encounter for follow-up examination after completed treatment for conditions other than malignant neoplasm: Secondary | ICD-10-CM

## 2012-01-29 NOTE — Progress Notes (Signed)
Chief complaint: Postop  Procedure: Status post laparoscopic cholecystectomy January 31  History of Present Ilness: 51 year old Caucasian female comes in today for her first postoperative appointment. She states that she is doing great. She states the majority of her symptoms have dramatically improved. She states that the bloating and pressure in her right upper quadrant has resolved. She states that her voice is not as hoarse as it was before surgery. She denies any fevers, chills, nausea, diarrhea, or constipation.  She did have 2 episodes of intermittent sharp right upper quadrant pain after eating a salad. She has also had some vague tenderness in her left upper quadrant.  Physical Exam: BP 120/76  Pulse 100  Resp 16  Ht 5\' 6"  (1.676 m)  Wt 197 lb (89.359 kg)  BMI 31.80 kg/m2  LMP 09/06/1990  Gen: alert, NAD, non-toxic appearing Pupils: equal, no scleral icterus Pulm: Lungs clear to auscultation, symmetric chest rise CV: regular rate and rhythm Abd: soft, nontender, nondistended. Well-healed trocar sites. No cellulitis. No incisional hernia Ext: no edema, no calf tenderness Skin: no rash, no jaundice  Pathology: Chronic cholecystitis and cholesterolosis  Assessment and Plan: 51 year old Caucasian female status post laparoscopic cholecystectomy for right upper quadrant pain and chronic cholecystitis  We discussed her pathology report.  I'm glad that she is feeling a lot better and that a lot of her symptoms have improved. With respect to the 2 episodes of intermittent right upper quadrant pain postoperatively, I think we will keep an eye on this. I've asked her to give it another couple weeks to see how it all resolves. If she has ongoing intermittent discomfort, then we will certainly check lab work. It is possible that she may be with need to be referred back to her gastroenterologist for additional workup. But we will give her a few more weeks to heal from  surgery.  Followup 6 weeks  Mary Sella. Andrey Campanile, MD, FACS General, Bariatric, & Minimally Invasive Surgery [facility], [place]

## 2012-01-29 NOTE — Patient Instructions (Signed)
Call if symptoms worsen or become more frequent

## 2012-02-06 ENCOUNTER — Other Ambulatory Visit: Payer: Self-pay

## 2012-02-27 ENCOUNTER — Encounter (INDEPENDENT_AMBULATORY_CARE_PROVIDER_SITE_OTHER): Payer: Self-pay | Admitting: General Surgery

## 2012-02-27 ENCOUNTER — Ambulatory Visit (INDEPENDENT_AMBULATORY_CARE_PROVIDER_SITE_OTHER): Payer: BC Managed Care – PPO | Admitting: General Surgery

## 2012-02-27 VITALS — BP 130/86 | HR 95 | Temp 97.0°F | Ht 66.0 in | Wt 193.0 lb

## 2012-02-27 DIAGNOSIS — L905 Scar conditions and fibrosis of skin: Secondary | ICD-10-CM

## 2012-02-27 DIAGNOSIS — N6489 Other specified disorders of breast: Secondary | ICD-10-CM

## 2012-02-27 NOTE — Patient Instructions (Signed)
Breast Biopsy WHY YOU NEED A BIOPSY Your caregiver has recommended that you have a breast tissue sample taken (biopsy). This is done to be certain that the lump or abnormality found in your breast is not cancerous (malignant). During a biopsy, a small piece of tissue is removed, so it can be examined under a microscope by a specialist (pathologist) who looks at tissues and cells and diagnoses abnormalities in them. Most lumps (tumors) or abnormalities, on or in the breast, are not cancerous (benign). However, biopsies are taken when your caregiver cannot be absolutely certain of what is wrong only from doing a physical exam, mammogram (breast X-ray), or other studies. A breast biopsy can tell you whether nothing more needs to be done, or you need more surgery or another type of treatment. A biopsy is done when there is:  Any undiagnosed breast mass.   Nipple abnormalities, dimpling, crusting, or ulcerations.   Calcium deposits (calcifications) or abnormalities seen on your mammogram, ultrasound, or MRI.   Suspicious changes in the breast (thickening, asymmetry) seen on mammogram.   Abnormal discharge from the nipple, especially blood.   Redness, swelling, and pain of the breast.  HOW A BIOPSY IS PERFORMED A biopsy is often performed on an outpatient basis (you go home the same day). This can be done in a hospital, clinic, or surgical center. Tissue samples (biopsies) are often done under local anesthesia (area is numbed). Sometimes general anesthetics are required, in which case you sleep through the procedure. Biopsies may remove the entire lump, a small piece of the lump, or a small sliver of tissue removed by needle. TYPES OF BREAST BIOPSY  Fine needle aspiration. A thin needle is placed through the skin, to the lump or cyst, and cells are removed.   Core needle biopsy. A large needle with a special tip is placed through the skin, to the abnormality, and a piece of tissue is removed.    Stereotactic biopsy. A core needle with a special X-ray is used, to direct the needle to the lump or abnormal area, which is difficult to feel or cannot be felt.   Vacuum-assisted biopsy. A hollow probe and a gentle vacuum remove a sample of tissue.   Ultrasound guided core needle biopsy. You lie on your stomach, with your breast through an opening, and a high frequency ultrasound helps guide the needle to the area of the abnormality.   Open biopsy. An incision is made in the breast, and a piece of the lump or the whole lump is removed.  LET YOUR CAREGIVER KNOW ABOUT:  Allergies.   Medicines taken, including herbs, eye drops, over-the-counter medicines, and creams.   Use of steroids (by mouth or creams).   Previous problems with anesthetics or Novocaine.   If you are taking aspirin or blood thinners.   Possibility of pregnancy, if this applies.   History of blood clots (thrombophlebitis).   History of bleeding or blood problems.   Previous surgery.   Other health problems.  RISKS AND COMPLICATIONS   Bleeding.   Infection.   Allergy to medicines.   Bruising and swelling of the breast.   Alteration in the shape of the breast.   Not finding the lump or abnormality.   Needing more surgery.  BEFORE THE PROCEDURE  You should arrive 60 minutes prior to your procedure or as directed.   Check-in at the admissions desk, to fill out necessary forms, if you are not preregistered.   There will be consent forms   to sign, prior to the procedure.   There is a waiting area for your family, while you are having your biopsy.   Try to have someone with you, to drive you home.   Do not smoke for 2 weeks before the surgery.   Let your caregiver know if you develop a cold or an infection.   Do not drink alcohol for at least 24 hours before surgery.   Wear a good support bra to the surgery.  AFTER THE PROCEDURE  After surgery, you will be taken to the recovery area, where a  nurse will watch and check your progress. Once you are awake, stable, and taking fluids well, if there are no other problems, you will be allowed to go home.   Ice packs applied to your operative site may help with discomfort and keep the swelling down.   You may resume normal diet and activities as directed. Avoid strenuous activities affecting the arm on the side of the biopsy, such as tennis, swimming, heavy lifting (more than 10 pounds) or pulling.   Bruising in the breast is normal following this procedure.   Wearing a support bra, even to bed, may be more comfortable. The bra will also help keep the dressing on.   Change dressings as directed.   Your doctor may apply a pressure dressing on your breast for 24 to 48 hours.   Only take over-the-counter or prescription medicines for pain, discomfort, or fever as directed by your caregiver.   Do not take aspirin, because it can cause bleeding.  HOME CARE INSTRUCTIONS   You may resume your usual diet.   Have someone drive you home after the surgery.   Do not do any exercise, driving, lifting or general activities without your caregiver's permission.   Take medicines and over-the-counter medicines, as ordered by your caregiver.   Keep your postoperative appointments as recommended.   Do not drink alcohol while taking pain medicine.  Finding out the results of your test Not all test results are available during your visit. If your test results are not back during the visit, make an appointment with your caregiver to find out the results. Do not assume everything is normal if you have not heard from your caregiver or the medical facility. It is important for you to follow up on all of your test results.  SEEK MEDICAL CARE IF:   You notice redness, swelling, or increasing pain in the wound.   You notice a bad smell coming from the wound or dressing.   You develop a rash.   You need stronger pain medicine.   You are having an  allergic reaction or problems with your medicines.  SEEK IMMEDIATE MEDICAL CARE IF:   You have difficulty breathing.   You have a fever.   There is increased bleeding (more than a small spot) from the wound.   Pus is coming from the wound.   The wound is breaking open.  Document Released: 11/25/2005 Document Revised: 11/14/2011 Document Reviewed: 10/13/2009 ExitCare Patient Information 2012 [employer]. 

## 2012-02-27 NOTE — Progress Notes (Signed)
Patient ID: Shari Ruiz, female   DOB: 13-Apr-1961, 51 y.o.   MRN: [id number]  Chief Complaint  Patient presents with  . Breast Mass    eval Rt br radial scar    HPI Shari Ruiz is a 51 y.o. female.   HPI 51 year old Caucasian female comes in to discuss her recent breast biopsy that she had performed by the radiologist. I most recently took out her gallbladder in early January. She states that she went for her routine screening mammogram in early January and an area of microcalcifications was discovered. She was called back for additional imaging. The additional imaging confirmed the area of calcifications. She was given a choice of short interval followup versus core biopsy. She elected to have core biopsy. The biopsy showed radial scar and she was referred here for consideration for surgical biopsy.  She states that she had a previous right breast biopsy in 2000 at a hospital in [place]. She states that it was a benign lesion. She states it was something like a fibroid. She denies any breast pain. She denies any nipple discharge. She does have a little bit of concavity on her right breast laterally from her prior breast biopsy. Menarche was at age 24. She is a G2 P2. Age of first pregnancy was 51 years of age. She has undergone a hysterectomy. She was on Premarin for about 14 years. She stopped Premarin in 2005.  Her maternal aunt was diagnosed with breast cancer in her 93s. Her maternal grandmother had colon cancer. Her father had prostate cancer. Past Medical History  Diagnosis Date  . Hypertension   . Arthritis   . Pneumonia   . Urinary tract infection   . Breast fibroadenoma     right  . Depression     IN 2008, HUSBAND LOST HIS JOB...HAD TO MOVE OUT OF THE HOUSE  . Dysrhythmia     ??  PVC'S........ASYMPTOMATIC  . GERD (gastroesophageal reflux disease)     Past Surgical History  Procedure Date  . Breast lumpectomy     ON THE RIGHT  . Abdominal  hysterectomy     FIBROIDS & ENDOMETRIOSIS  . Knee arthroscopy     RIGHT KNEE  . Cholecystectomy 01/09/2012    Procedure: LAPAROSCOPIC CHOLECYSTECTOMY;  Surgeon: Atilano Ina, MD;  Location: [facility] OR;  Service: General;  Laterality: N/A;  Laparoscopic cholecystectomy with attempted intraoperative cholangiogram    Family History  Problem Relation Age of Onset  . Cancer Maternal Aunt     breast and liver  . Heart disease Maternal Uncle   . Cancer Maternal Grandmother     colon    Social History History  Substance Use Topics  . Smoking status: Former Smoker -- 0.2 packs/day for 1 years    Quit date: 01/07/1980  . Smokeless tobacco: Not on file  . Alcohol Use: Yes    No Known Allergies  Current Outpatient Prescriptions  Medication Sig Dispense Refill  . Calcium-Magnesium-Zinc 333-133-5 MG TABS Take 2 tablets by mouth daily.      . cyanocobalamin 500 MCG tablet Take 1,000 mcg by mouth daily.      . famotidine (PEPCID) 20 MG tablet Take 20 mg by mouth 2 (two) times daily.      . hydrochlorothiazide (HYDRODIURIL) 25 MG tablet Take 25 mg by mouth daily.      Marland Kitchen losartan (COZAAR) 100 MG tablet Take 50 mg by mouth daily.      . Magnesium 250  MG TABS Take 500 mg by mouth daily.        Review of Systems Review of Systems  Constitutional: Negative for fever, chills and unexpected weight change.  HENT: Negative for hearing loss, congestion, sore throat, trouble swallowing and voice change.   Eyes: Negative for visual disturbance.  Respiratory: Negative for cough, shortness of breath and wheezing.   Cardiovascular: Negative for chest pain, palpitations and leg swelling.  Gastrointestinal: Negative for nausea, vomiting, abdominal pain, diarrhea, constipation, blood in stool, abdominal distention and anal bleeding.  Genitourinary: Negative for hematuria, vaginal bleeding and difficulty urinating.  Musculoskeletal: Negative for arthralgias.  Skin: Negative for rash and wound.  Neurological:  Negative for seizures, syncope and headaches.  Hematological: Negative for adenopathy. Does not bruise/bleed easily.  Psychiatric/Behavioral: Negative for confusion.    Blood pressure 130/86, pulse 95, temperature 97 F (36.1 C), temperature source Temporal, height 5\' 6"  (1.676 m), weight 193 lb (87.544 kg), last menstrual period 09/06/1990, SpO2 94.00%.  Physical Exam Physical Exam  Vitals reviewed. Constitutional: She is oriented to person, place, and time. She appears well-developed and well-nourished. No distress.  HENT:  Head: Normocephalic and atraumatic.  Right Ear: External ear normal.  Left Ear: External ear normal.  Eyes: Conjunctivae are normal. No scleral icterus.  Neck: Normal range of motion. Neck supple. No tracheal deviation present. No thyromegaly present.  Cardiovascular: Normal rate, regular rhythm and normal heart sounds.   Pulmonary/Chest: Effort normal and breath sounds normal. No respiratory distress. She has no wheezes. Right breast exhibits tenderness. Right breast exhibits no inverted nipple, no nipple discharge and no skin change. Left breast exhibits no inverted nipple, no nipple discharge, no skin change and no tenderness. Breasts are symmetrical.         Right breast- old horizontal incision in Rt lower outer quad at about 9 o'clock about 3cm from NAC. Some mild TTP in rt outer lower quad. No bruising. Small palpable lump at 8 o'clock position just behind NAC - ? hematoma  Abdominal: Soft. Bowel sounds are normal. She exhibits no distension.  Musculoskeletal: Normal range of motion. She exhibits no edema and no tenderness.  Lymphadenopathy:    She has no cervical adenopathy.    She has no axillary adenopathy.       Right: No supraclavicular adenopathy present.       Left: No supraclavicular adenopathy present.  Neurological: She is alert and oriented to person, place, and time. She exhibits normal muscle tone.  Skin: Skin is warm and dry. No rash noted.  She is not diaphoretic. No erythema.  Psychiatric: She has a normal mood and affect. Her behavior is normal. Judgment and thought content normal.    Data Reviewed Mammogram images and report. Biopsy report and path report  Pathology report: Right breast needle core biopsy-complex sclerosing lesion. No malignant epithelial features present. The lesion is best possible for as a complex sclerosing lesion-radial scar.  Mammogram screening January 19 Compared to previous, on the right there is a potential abnormality, clustered microcalcifications in linear orientation centrally. There is been no significant interval change on the left. BI-RADS zero  Mammogram unilateral right-sided January 29 Additional imaging of the right breast shows no suspicious mass, asymmetry or architectural distortion. A cluster of calcifications in linear distribution is located at about 6:00 mid-depth position. The calcifications are dystrophic in appearance and probably benign.  Stereotactic vacuum assisted core biopsy of the right breast February 28 The area in question in the right breast was identified  and ILM approach was selected. The breast was prepped. The vacuum-assisted 10-gauge core biopsy needle was introduced. 5 cores were obtained. The tissue marker was deployed into the biopsy cavity.  Assessment    52 year old Caucasian female with a right breast radial scar-complex sclerosing lesion    Plan    We reviewed her mammograms. I reviewed her pathology report with her. We discussed the etiology and management of radial scar. Based on the biopsy results and her family history, I have recommended surgical biopsy. We discussed how a surgical biopsy would be performed-with a needle localized guidewire.  We discussed the risk and benefits of surgery including but not limited to bleeding, infection, hematoma formation, seroma formation, need for additional procedures depending on the pathology report,  anesthesia risk, blood clot formation, and cosmetic concerns such as dimpling. We discussed the typical postop course.  We will schedule her for a right breast needle localized breast biopsy in the near future  Collie Wernick M. Andrey Campanile, MD, FACS General, Bariatric, & Minimally Invasive Surgery [facility], [place]        [facility] M 02/27/2012, 10:27 AM

## 2012-03-11 ENCOUNTER — Encounter (INDEPENDENT_AMBULATORY_CARE_PROVIDER_SITE_OTHER): Payer: BC Managed Care – PPO | Admitting: General Surgery

## 2012-03-13 ENCOUNTER — Encounter (HOSPITAL_BASED_OUTPATIENT_CLINIC_OR_DEPARTMENT_OTHER): Payer: Self-pay | Admitting: *Deleted

## 2012-03-13 NOTE — Progress Notes (Signed)
To come in for bmet Had ekg-copy used with gb 1/13

## 2012-03-16 ENCOUNTER — Encounter (HOSPITAL_BASED_OUTPATIENT_CLINIC_OR_DEPARTMENT_OTHER)
Admission: RE | Admit: 2012-03-16 | Discharge: 2012-03-16 | Disposition: A | Discharge status: Home / Self Care | Payer: BC Managed Care – PPO | Source: Ambulatory Visit | Attending: General Surgery | Admitting: General Surgery

## 2012-03-16 LAB — BASIC METABOLIC PANEL
Chloride: 103 mEq/L (ref 96–112)
Creatinine, Ser: 1.11 mg/dL — ABNORMAL HIGH (ref 0.50–1.10)
GFR calc Af Amer: 65 mL/min — ABNORMAL LOW (ref >90)
GFR calc non Af Amer: 56 mL/min — ABNORMAL LOW (ref >90)
Potassium: 4.7 mEq/L (ref 3.5–5.1)

## 2012-03-16 IMAGING — CT CT ABD-PELV W/ CM
2 of 5 series · 17 of 46 positions shown, 19 images · IV contrast (Omnipaque 300)
Comparison: None

CLINICAL DATA: Left-sided abdominal pain, nausea, diarrhea.

CT ABDOMEN AND PELVIS WITH CONTRAST
TECHNIQUE: Multidetector CT imaging of the abdomen and pelvis was
performed following the standard protocol during bolus
administration of intravenous contrast.
Contrast: 100 ml Omnipaque 300 IV.

[Series 2: abd/ pel 5mm · axial · 0.75mm/px · z∈[-471,-46]mm · 14 of 95 slices shown, 16 images]
[im 5/95  soft-tissue]
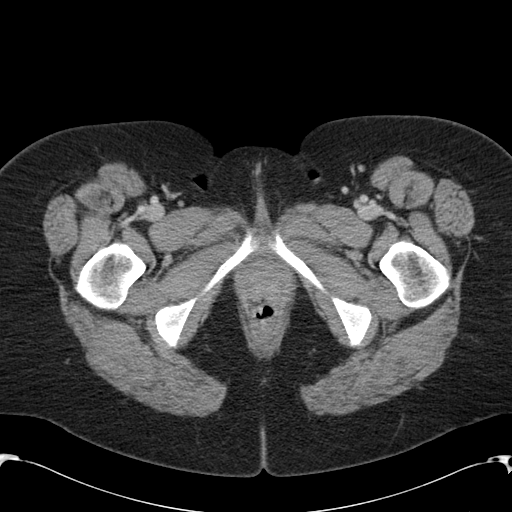
[im 5/95  bone]
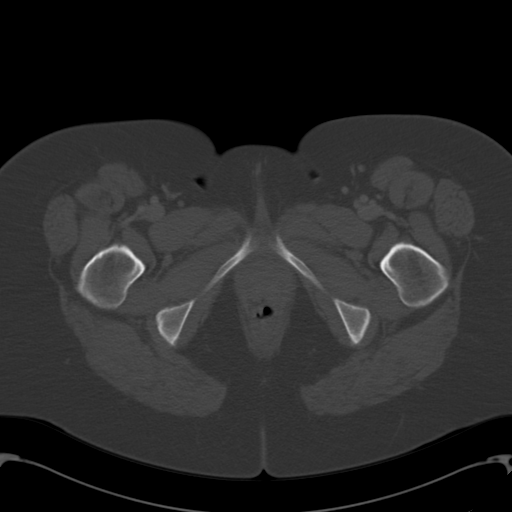
[im 15/95  soft-tissue]
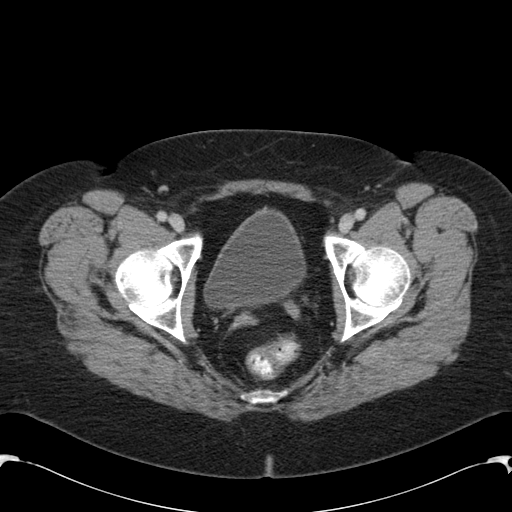
[im 19/95  soft-tissue]
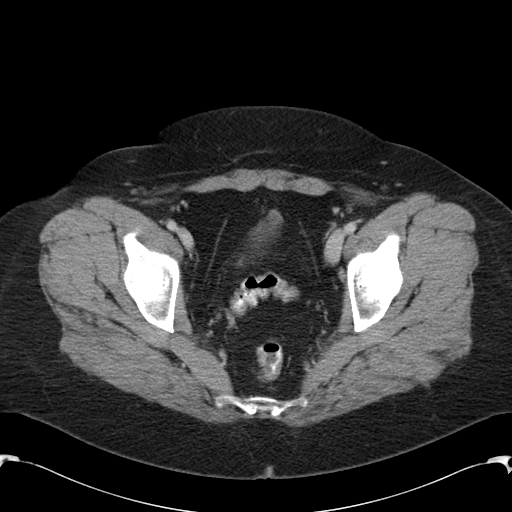
[im 24/95  soft-tissue]
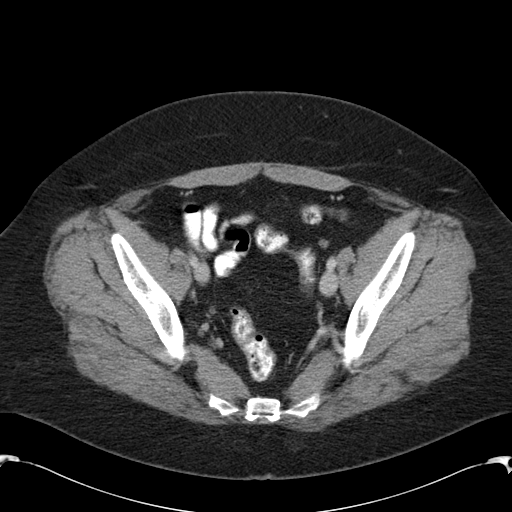
[im 33/95  soft-tissue]
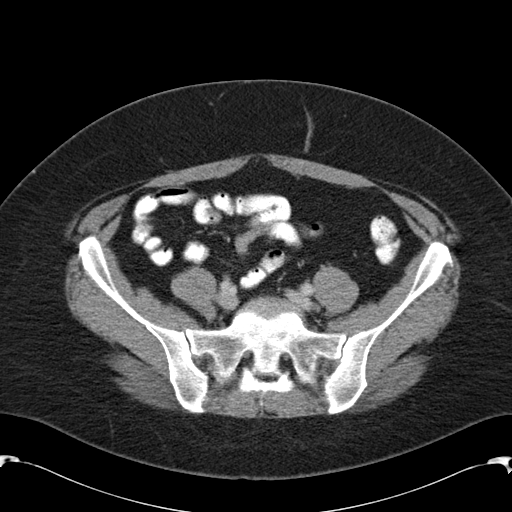
[im 38/95  soft-tissue]
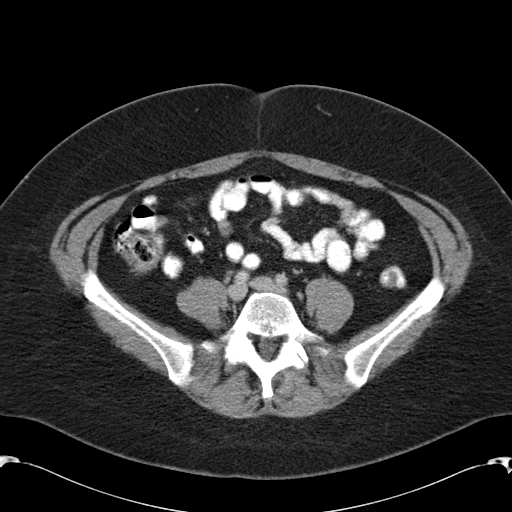
[im 43/95  soft-tissue]
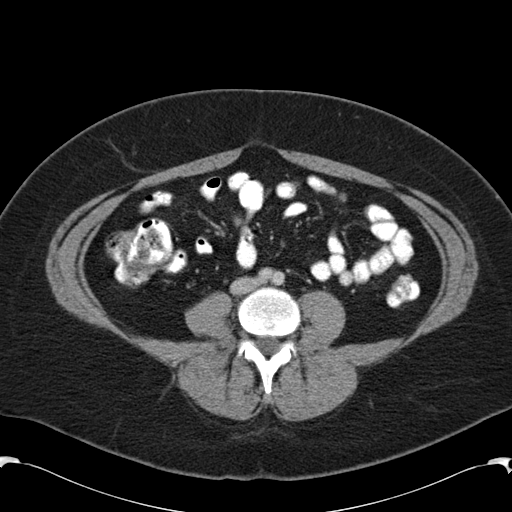
[im 52/95  soft-tissue]
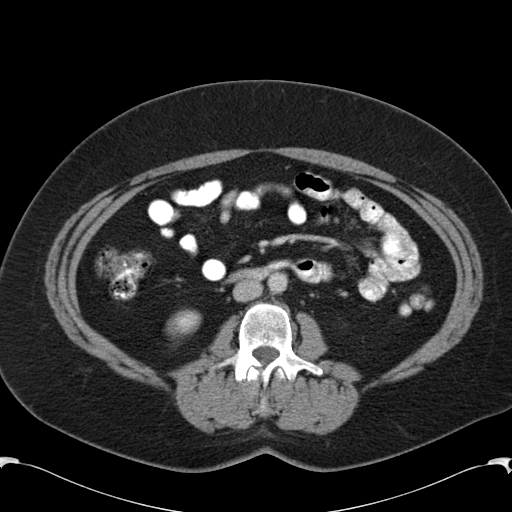
[im 57/95  soft-tissue]
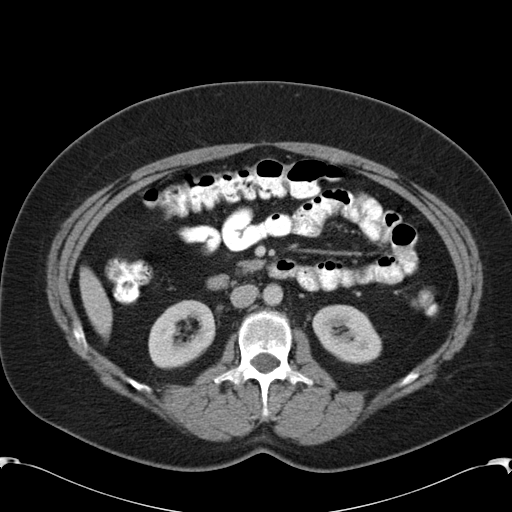
[im 57/95  bone]
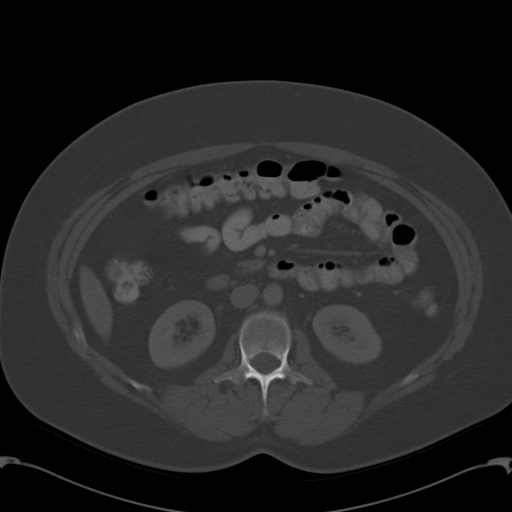
[im 62/95  soft-tissue]
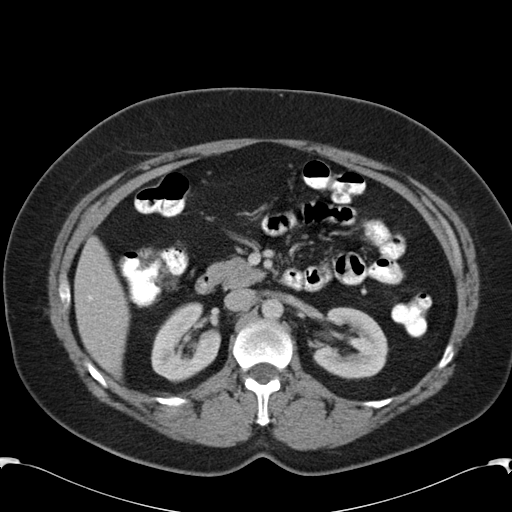
[im 71/95  soft-tissue]
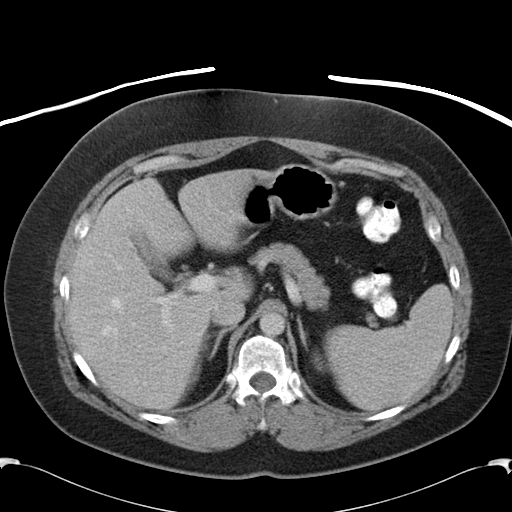
[im 76/95  soft-tissue]
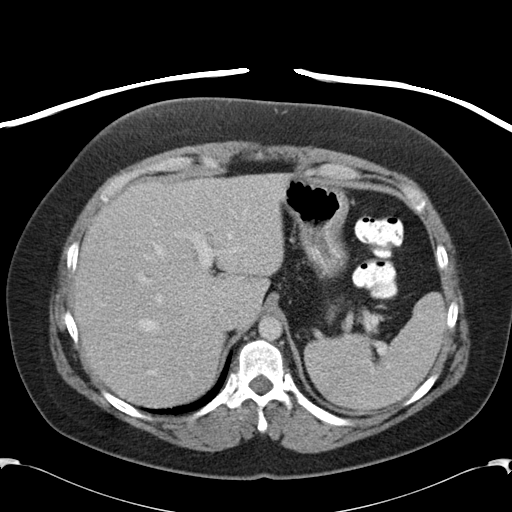
[im 80/95  soft-tissue]
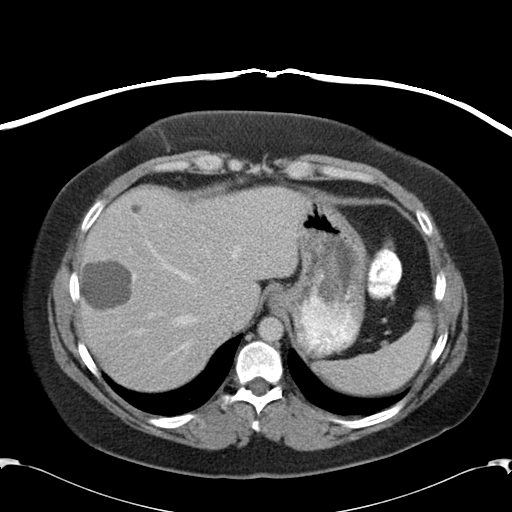
[im 90/95  soft-tissue]
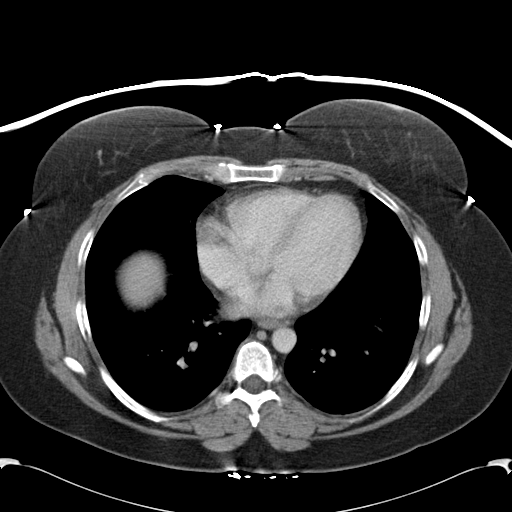

[Series 602: cor · coronal · 0.96mm/px · 3 of 105 slices shown]
[im 35/105  soft-tissue]
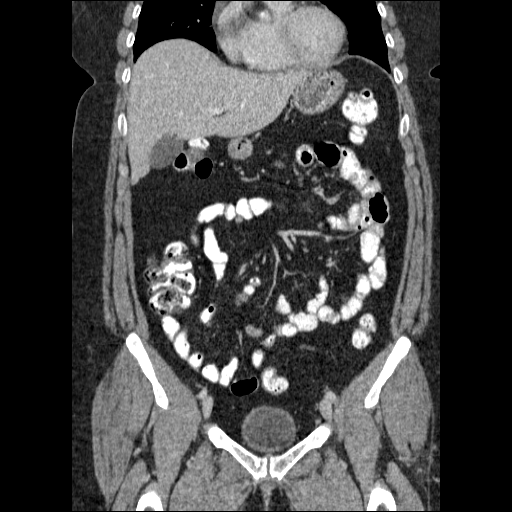
[im 47/105  soft-tissue]
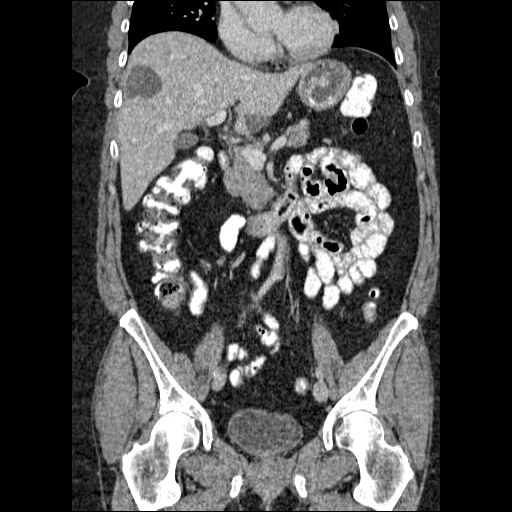
[im 58/105  soft-tissue]
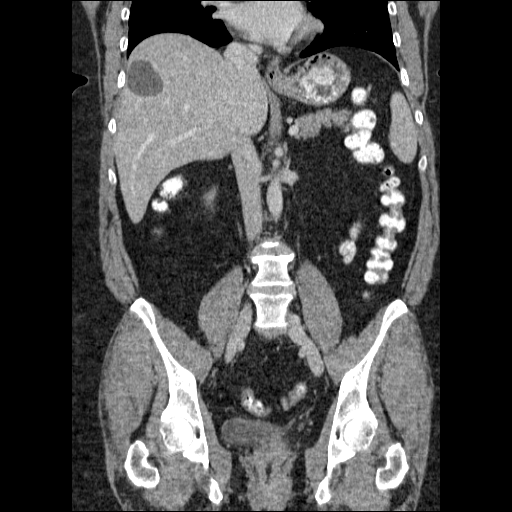

[17 of 46 positions shown; findings below may reference images not displayed]

FINDINGS: Lung bases are clear.  No effusions.  Heart is normal
size.

Multiple low-density lesions throughout the liver compatible with
cysts.  The largest is in the right hepatic lobe measuring 3.7 cm.
No suspicious liver lesions.  Gallbladder, spleen, pancreas,
adrenals and kidneys are unremarkable.

Bowel grossly unremarkable.  No free fluid, free air, or
adenopathy.  Aorta is normal caliber.  No acute bony abnormality.
IMPRESSION: Multiple hepatic cysts.

No acute findings in the abdomen or pelvis.

## 2012-03-19 ENCOUNTER — Encounter (HOSPITAL_BASED_OUTPATIENT_CLINIC_OR_DEPARTMENT_OTHER): Payer: Self-pay | Admitting: Anesthesiology

## 2012-03-19 ENCOUNTER — Ambulatory Visit (HOSPITAL_BASED_OUTPATIENT_CLINIC_OR_DEPARTMENT_OTHER): Payer: BC Managed Care – PPO | Admitting: Anesthesiology

## 2012-03-19 ENCOUNTER — Encounter (HOSPITAL_BASED_OUTPATIENT_CLINIC_OR_DEPARTMENT_OTHER)
Admission: RE | Disposition: A | Discharge status: Home / Self Care | Payer: Self-pay | Source: Ambulatory Visit | Attending: General Surgery

## 2012-03-19 ENCOUNTER — Ambulatory Visit (HOSPITAL_BASED_OUTPATIENT_CLINIC_OR_DEPARTMENT_OTHER)
Admission: RE | Admit: 2012-03-19 | Discharge: 2012-03-19 | Disposition: A | Discharge status: Home / Self Care | Payer: BC Managed Care – PPO | Source: Ambulatory Visit | Attending: General Surgery | Admitting: General Surgery

## 2012-03-19 DIAGNOSIS — D249 Benign neoplasm of unspecified breast: Secondary | ICD-10-CM | POA: Insufficient documentation

## 2012-03-19 DIAGNOSIS — Z01812 Encounter for preprocedural laboratory examination: Secondary | ICD-10-CM | POA: Insufficient documentation

## 2012-03-19 DIAGNOSIS — N6019 Diffuse cystic mastopathy of unspecified breast: Secondary | ICD-10-CM

## 2012-03-19 DIAGNOSIS — I10 Essential (primary) hypertension: Secondary | ICD-10-CM | POA: Insufficient documentation

## 2012-03-19 DIAGNOSIS — K219 Gastro-esophageal reflux disease without esophagitis: Secondary | ICD-10-CM | POA: Insufficient documentation

## 2012-03-19 HISTORY — PX: BREAST BIOPSY: SHX20

## 2012-03-19 SURGERY — BREAST BIOPSY WITH NEEDLE LOCALIZATION
Anesthesia: General | Site: Breast | Laterality: Right | Wound class: Clean

## 2012-03-19 MED ORDER — ACETAMINOPHEN 10 MG/ML IV SOLN
INTRAVENOUS | Status: DC | PRN
  Administered 2012-03-19: 1000 mg via INTRAVENOUS

## 2012-03-19 MED ORDER — MIDAZOLAM HCL 5 MG/5ML IJ SOLN
INTRAMUSCULAR | Status: DC | PRN
  Administered 2012-03-19: 2 mg via INTRAVENOUS

## 2012-03-19 MED ORDER — BUPIVACAINE-EPINEPHRINE 0.25% -1:200000 IJ SOLN
INTRAMUSCULAR | Status: DC | PRN
  Administered 2012-03-19: 30 mL

## 2012-03-19 MED ORDER — CEFAZOLIN SODIUM 1-5 GM-% IV SOLN
1.0000 g | INTRAVENOUS | Status: AC
  Administered 2012-03-19: 1 g via INTRAVENOUS

## 2012-03-19 MED ORDER — FENTANYL CITRATE 0.05 MG/ML IJ SOLN
INTRAMUSCULAR | Status: DC | PRN
  Administered 2012-03-19 (×2): 50 ug via INTRAVENOUS

## 2012-03-19 MED ORDER — ACETAMINOPHEN 10 MG/ML IV SOLN: 1000.0000 mg | Freq: Once | INTRAVENOUS | Status: DC

## 2012-03-19 MED ORDER — METOCLOPRAMIDE HCL 5 MG/ML IJ SOLN: 10.0000 mg | Freq: Once | INTRAMUSCULAR | Status: DC | PRN

## 2012-03-19 MED ORDER — LIDOCAINE HCL (CARDIAC) 20 MG/ML IV SOLN
INTRAVENOUS | Status: DC | PRN
  Administered 2012-03-19: 75 mg via INTRAVENOUS

## 2012-03-19 MED ORDER — CHLORHEXIDINE GLUCONATE 4 % EX LIQD: 1.0000 "application " | Freq: Once | CUTANEOUS | Status: DC

## 2012-03-19 MED ORDER — OXYCODONE-ACETAMINOPHEN 5-325 MG PO TABS: 1.0000 | ORAL_TABLET | ORAL | Status: DC | PRN

## 2012-03-19 MED ORDER — ONDANSETRON HCL 4 MG/2ML IJ SOLN
INTRAMUSCULAR | Status: DC | PRN
  Administered 2012-03-19: 4 mg via INTRAVENOUS

## 2012-03-19 MED ORDER — LACTATED RINGERS IV SOLN
INTRAVENOUS | Status: DC
  Administered 2012-03-19 (×2): via INTRAVENOUS

## 2012-03-19 MED ORDER — DEXAMETHASONE SODIUM PHOSPHATE 4 MG/ML IJ SOLN
INTRAMUSCULAR | Status: DC | PRN
  Administered 2012-03-19: 10 mg via INTRAVENOUS

## 2012-03-19 MED ORDER — OXYCODONE HCL 5 MG PO TABS
5.0000 mg | ORAL_TABLET | ORAL | Status: DC | PRN
  Administered 2012-03-19: 5 mg via ORAL

## 2012-03-19 MED ORDER — MIDAZOLAM HCL 2 MG/2ML IJ SOLN: 0.5000 mg | INTRAMUSCULAR | Status: DC | PRN

## 2012-03-19 MED ORDER — HYDROMORPHONE HCL PF 1 MG/ML IJ SOLN
0.2500 mg | INTRAMUSCULAR | Status: DC | PRN
  Administered 2012-03-19: 0.5 mg via INTRAVENOUS

## 2012-03-19 MED ORDER — PROPOFOL 10 MG/ML IV EMUL
INTRAVENOUS | Status: DC | PRN
  Administered 2012-03-19: 200 mg via INTRAVENOUS

## 2012-03-19 MED ORDER — METOCLOPRAMIDE HCL 5 MG/ML IJ SOLN
INTRAMUSCULAR | Status: DC | PRN
  Administered 2012-03-19: 10 mg via INTRAVENOUS

## 2012-03-19 SURGICAL SUPPLY — 47 items
APPLIER CLIP 9.375 MED OPEN (MISCELLANEOUS)
BANDAGE ELASTIC 6 VELCRO ST LF (GAUZE/BANDAGES/DRESSINGS) IMPLANT
BENZOIN TINCTURE PRP APPL 2/3 (GAUZE/BANDAGES/DRESSINGS) ×2 IMPLANT
BLADE SURG 15 STRL LF DISP TIS (BLADE) ×1 IMPLANT
BLADE SURG 15 STRL SS (BLADE) ×1
CANISTER SUCTION 1200CC (MISCELLANEOUS) ×2 IMPLANT
CHLORAPREP W/TINT 26ML (MISCELLANEOUS) ×2 IMPLANT
CLIP APPLIE 9.375 MED OPEN (MISCELLANEOUS) IMPLANT
COVER MAYO STAND STRL (DRAPES) ×2 IMPLANT
COVER TABLE BACK 60X90 (DRAPES) ×2 IMPLANT
DECANTER SPIKE VIAL GLASS SM (MISCELLANEOUS) IMPLANT
DEVICE DUBIN W/COMP PLATE 8390 (MISCELLANEOUS) ×2 IMPLANT
DRAPE PED LAPAROTOMY (DRAPES) ×2 IMPLANT
DRAPE UTILITY XL STRL (DRAPES) ×2 IMPLANT
DRSG TEGADERM 4X4.75 (GAUZE/BANDAGES/DRESSINGS) ×2 IMPLANT
ELECT COATED BLADE 2.86 ST (ELECTRODE) ×2 IMPLANT
ELECT REM PT RETURN 9FT ADLT (ELECTROSURGICAL) ×2
ELECTRODE REM PT RTRN 9FT ADLT (ELECTROSURGICAL) ×1 IMPLANT
GAUZE SPONGE 4X4 12PLY STRL LF (GAUZE/BANDAGES/DRESSINGS) ×2 IMPLANT
GLOVE BIO SURGEON STRL SZ7.5 (GLOVE) ×2 IMPLANT
GLOVE BIOGEL PI IND STRL 7.0 (GLOVE) ×1 IMPLANT
GLOVE BIOGEL PI IND STRL 8 (GLOVE) ×1 IMPLANT
GLOVE BIOGEL PI INDICATOR 7.0 (GLOVE) ×1
GLOVE BIOGEL PI INDICATOR 8 (GLOVE) ×1
GLOVE ECLIPSE 6.5 STRL STRAW (GLOVE) ×2 IMPLANT
GOWN PREVENTION PLUS XLARGE (GOWN DISPOSABLE) ×2 IMPLANT
KIT MARKER MARGIN INK (KITS) IMPLANT
NEEDLE HYPO 25X1 1.5 SAFETY (NEEDLE) ×2 IMPLANT
NS IRRIG 1000ML POUR BTL (IV SOLUTION) ×2 IMPLANT
PACK BASIN DAY SURGERY FS (CUSTOM PROCEDURE TRAY) ×2 IMPLANT
PENCIL BUTTON HOLSTER BLD 10FT (ELECTRODE) ×2 IMPLANT
SLEEVE SCD COMPRESS KNEE MED (MISCELLANEOUS) ×2 IMPLANT
SPONGE GAUZE 2X2 8PLY STRL LF (GAUZE/BANDAGES/DRESSINGS) IMPLANT
SPONGE LAP 18X18 X RAY DECT (DISPOSABLE) ×2 IMPLANT
SPONGE LAP 4X18 X RAY DECT (DISPOSABLE) IMPLANT
STRIP CLOSURE SKIN 1/2X4 (GAUZE/BANDAGES/DRESSINGS) ×2 IMPLANT
SUT MNCRL AB 4-0 PS2 18 (SUTURE) ×2 IMPLANT
SUT SILK 2 0 SH (SUTURE) IMPLANT
SUT VIC AB 3-0 SH 27 (SUTURE)
SUT VIC AB 3-0 SH 27X BRD (SUTURE) IMPLANT
SUT VICRYL 3-0 CR8 SH (SUTURE) ×2 IMPLANT
SYR CONTROL 10ML LL (SYRINGE) ×2 IMPLANT
TOWEL OR 17X24 6PK STRL BLUE (TOWEL DISPOSABLE) ×2 IMPLANT
TOWEL OR NON WOVEN STRL DISP B (DISPOSABLE) ×2 IMPLANT
TUBE CONNECTING 20X1/4 (TUBING) ×2 IMPLANT
WATER STERILE IRR 1000ML POUR (IV SOLUTION) IMPLANT
YANKAUER SUCT BULB TIP NO VENT (SUCTIONS) ×2 IMPLANT

## 2012-03-19 NOTE — Brief Op Note (Signed)
03/19/2012  10:32 AM  PATIENT:  Shari Ruiz  51 y.o. female  PRE-OPERATIVE DIAGNOSIS:  Right breast complex sclerosing lesion - radial scar  POST-OPERATIVE DIAGNOSIS:  same  PROCEDURE:  Procedure(s) (LRB): BREAST BIOPSY WITH NEEDLE LOCALIZATION (Right)  SURGEON:  Surgeon(s) and Role:    * Atilano Ina, MD,FACS - Primary  PHYSICIAN ASSISTANT: none  ASSISTANTS: none   ANESTHESIA:   general and with LMA  EBL:  Total I/O In: 1000 [I.V.:1000] Out: -   BLOOD ADMINISTERED:none  DRAINS: none   LOCAL MEDICATIONS USED:  MARCAINE    and Amount: 30 ml  SPECIMEN:  Source of Specimen:  right breast biopsy, single - superior, wire - lateral, double -deep, long - medial  DISPOSITION OF SPECIMEN:  PATHOLOGY  COUNTS:  YES  TOURNIQUET:  * No tourniquets in log *  DICTATION: .Other Dictation: Dictation Number [id number]  PLAN OF CARE: Discharge to home after PACU  PATIENT DISPOSITION:  PACU - hemodynamically stable.   Delay start of Pharmacological VTE agent (>24hrs) due to surgical blood loss or risk of bleeding: not applicable  Mary Sella. Andrey Campanile, MD, FACS General, Bariatric, & Minimally Invasive Surgery [facility], [place]

## 2012-03-19 NOTE — Discharge Instructions (Signed)
[facility] Office Phone Number [phone]  BREAST BIOPSY/ PARTIAL MASTECTOMY: POST OP INSTRUCTIONS  Always review your discharge instruction sheet given to you by the facility where your surgery was performed.  IF YOU HAVE DISABILITY OR FAMILY LEAVE FORMS, YOU MUST BRING THEM TO THE OFFICE FOR PROCESSING.  DO NOT GIVE THEM TO YOUR DOCTOR.  1. A prescription for pain medication may be given to you upon discharge.  Take your pain medication as prescribed, if needed.  If narcotic pain medicine is not needed, then you may take acetaminophen (Tylenol) or ibuprofen (Advil) as needed. 2. Take your usually prescribed medications unless otherwise directed 3. If you need a refill on your pain medication, please contact your pharmacy.  They will contact our office to request authorization.  Prescriptions will not be filled after 5pm or on week-ends. 4. You should eat very light the first 24 hours after surgery, such as soup, crackers, pudding, etc.  Resume your normal diet the day after surgery. 5. Most patients will experience some swelling and bruising in the breast.  Ice packs and a good support bra will help.  Swelling and bruising can take several days to resolve.  6. It is common to experience some constipation if taking pain medication after surgery.  Increasing fluid intake and taking a stool softener will usually help or prevent this problem from occurring.  A mild laxative (Milk of Magnesia or Miralax) should be taken according to package directions if there are no bowel movements after 48 hours. 7. Unless discharge instructions indicate otherwise, you may remove your bandages (clear bandage and gauze) 48 hours after surgery, and you may shower at that time.  You may have steri-strips (small skin tapes) in place directly over the incision.  These strips should be left on the skin for 7-10 days.   8. ACTIVITIES:  You may resume regular daily activities (gradually increasing)  beginning the next day.  Wearing a good support bra or sports bra minimizes pain and swelling.  You may have sexual intercourse when it is comfortable. a. You may drive when you no longer are taking prescription pain medication, you can comfortably wear a seatbelt, and you can safely maneuver your car and apply brakes. b. RETURN TO WORK:  Several days 9. You should see your doctor in the office for a follow-up appointment approximately two weeks after your surgery.  Your doctor's nurse will typically make your follow-up appointment when she calls you with your pathology report.  Expect your pathology report 2-3 business days after your surgery.  You may call to check if you do not hear from [place] after three days. OTHER INSTRUCTIONS:  WHEN TO CALL YOUR DOCTOR: 1. Fever over 101.0 2. Nausea and/or vomiting. 3. Extreme swelling or bruising. 4. Continued bleeding from incision. 5. Increased pain, redness, or drainage from the incision.  The clinic staff is available to answer your questions during regular business hours.  Please don't hesitate to call and ask to speak to one of the nurses for clinical concerns.  If you have a medical emergency, go to the nearest emergency room or call 911.  A surgeon from [facility] is always on call at the hospital.  For further questions, please visit [email or web address]   Post Anesthesia Home Care Instructions  Activity: Get plenty of rest for the remainder of the day. A responsible adult should stay with you for 24 hours following the procedure.  For the next 24 hours, DO NOT: -Drive a  car -Advertising copywriter -Drink alcoholic beverages -Take any medication unless instructed by your physician -Make any legal decisions or sign important papers.  Meals: Start with liquid foods such as gelatin or soup. Progress to regular foods as tolerated. Avoid greasy, spicy, heavy foods. If nausea and/or vomiting occur, drink only clear liquids until the  nausea and/or vomiting subsides. Call your physician if vomiting continues.  Special Instructions/Symptoms: Your throat may feel dry or sore from the anesthesia or the breathing tube placed in your throat during surgery. If this causes discomfort, gargle with warm salt water. The discomfort should disappear within 24 hours.

## 2012-03-19 NOTE — Interval H&P Note (Signed)
History and Physical Interval Note:  03/19/2012 9:09 AM  Shari Ruiz  has presented today for surgery, with the diagnosis of Right breast radial scar  The various methods of treatment have been discussed with the patient and family. After consideration of risks, benefits and other options for treatment, the patient has consented to  Procedure(s) (LRB): BREAST BIOPSY WITH NEEDLE LOCALIZATION (Right) as a surgical intervention .  The patients' history has been reviewed, patient examined, no change in status, stable for surgery.  I have reviewed the patients' chart and labs.  Questions were answered to the patient's satisfaction.  Pt reports some burning in her right breast otherwise nothing new. Denies fevers, chills, nausea, vomiting, cp, sob, redness of breast.   Mary Sella. Andrey Campanile, MD, FACS General, Bariatric, & Minimally Invasive Surgery [facility], [place]    [facility] M

## 2012-03-19 NOTE — Transfer of Care (Signed)
Immediate Anesthesia Transfer of Care Note  Patient: Shari Ruiz  Procedure(s) Performed: Procedure(s) (LRB): BREAST BIOPSY WITH NEEDLE LOCALIZATION (Right)  Patient Location: PACU  Anesthesia Type: General  Level of Consciousness: awake, alert  and oriented  Airway & Oxygen Therapy: Patient Spontanous Breathing and Patient connected to face mask oxygen  Post-op Assessment: Report given to PACU RN and Post -op Vital signs reviewed and stable  Post vital signs: Reviewed and stable  Complications: No apparent anesthesia complications

## 2012-03-19 NOTE — H&P (View-Only) (Signed)
Patient ID: Shari Ruiz, female   DOB: [birth year], 51 y.o.   MRN: [id number]  Chief Complaint  Patient presents with  . Breast Mass    eval Rt br radial scar    HPI Shari Ruiz is a 51 y.o. female.   HPI 51 year old Caucasian female comes in to discuss her recent breast biopsy that she had performed by the radiologist. I most recently took out her gallbladder in early January. She states that she went for her routine screening mammogram in early January and an area of microcalcifications was discovered. She was called back for additional imaging. The additional imaging confirmed the area of calcifications. She was given a choice of short interval followup versus core biopsy. She elected to have core biopsy. The biopsy showed radial scar and she was referred here for consideration for surgical biopsy.  She states that she had a previous right breast biopsy in 2000 at a hospital in [place] specifically [place]. She states that it was a benign lesion. She states it was something like a fibroid. She denies any breast pain. She denies any nipple discharge. She does have a little bit of concavity on her right breast laterally from her prior breast biopsy. Menarche was at age 34. She is a G2 P2. Age of first pregnancy was 51 years of age. She has undergone a hysterectomy. She was on Premarin for about 14 years. She stopped Premarin in 2005.  Her maternal aunt was diagnosed with breast cancer in her 70s. Her maternal grandmother had colon cancer. Her father had prostate cancer. Past Medical History  Diagnosis Date  . Hypertension   . Arthritis   . Pneumonia   . Urinary tract infection   . Breast fibroadenoma     right  . Depression     IN 2008, HUSBAND LOST HIS JOB...HAD TO MOVE OUT OF THE HOUSE  . Dysrhythmia     ??  PVC'S........ASYMPTOMATIC  . GERD (gastroesophageal reflux disease)     Past Surgical History  Procedure Date  . Breast lumpectomy     ON THE RIGHT  . Abdominal  hysterectomy     FIBROIDS & ENDOMETRIOSIS  . Knee arthroscopy     RIGHT KNEE  . Cholecystectomy 01/09/2012    Procedure: LAPAROSCOPIC CHOLECYSTECTOMY;  Surgeon: Atilano Ina, MD;  Location: [facility] OR;  Service: General;  Laterality: N/A;  Laparoscopic cholecystectomy with attempted intraoperative cholangiogram    Family History  Problem Relation Age of Onset  . Cancer Maternal Aunt     breast and liver  . Heart disease Maternal Uncle   . Cancer Maternal Grandmother     colon    Social History History  Substance Use Topics  . Smoking status: Former Smoker -- 0.2 packs/day for 1 years    Quit date: 01/07/1980  . Smokeless tobacco: Not on file  . Alcohol Use: Yes    No Known Allergies  Current Outpatient Prescriptions  Medication Sig Dispense Refill  . Calcium-Magnesium-Zinc 333-133-5 MG TABS Take 2 tablets by mouth daily.      . cyanocobalamin 500 MCG tablet Take 1,000 mcg by mouth daily.      . famotidine (PEPCID) 20 MG tablet Take 20 mg by mouth 2 (two) times daily.      . hydrochlorothiazide (HYDRODIURIL) 25 MG tablet Take 25 mg by mouth daily.      Marland Kitchen losartan (COZAAR) 100 MG tablet Take 50 mg by mouth daily.      . Magnesium 250  MG TABS Take 500 mg by mouth daily.        Review of Systems Review of Systems  Constitutional: Negative for fever, chills and unexpected weight change.  HENT: Negative for hearing loss, congestion, sore throat, trouble swallowing and voice change.   Eyes: Negative for visual disturbance.  Respiratory: Negative for cough, shortness of breath and wheezing.   Cardiovascular: Negative for chest pain, palpitations and leg swelling.  Gastrointestinal: Negative for nausea, vomiting, abdominal pain, diarrhea, constipation, blood in stool, abdominal distention and anal bleeding.  Genitourinary: Negative for hematuria, vaginal bleeding and difficulty urinating.  Musculoskeletal: Negative for arthralgias.  Skin: Negative for rash and wound.  Neurological:  Negative for seizures, syncope and headaches.  Hematological: Negative for adenopathy. Does not bruise/bleed easily.  Psychiatric/Behavioral: Negative for confusion.    Blood pressure 130/86, pulse 95, temperature 97 F (36.1 C), temperature source Temporal, height 5\' 6"  (1.676 m), weight 193 lb (87.544 kg), last menstrual period 09/06/1990, SpO2 94.00%.  Physical Exam Physical Exam  Vitals reviewed. Constitutional: She is oriented to person, place, and time. She appears well-developed and well-nourished. No distress.  HENT:  Head: Normocephalic and atraumatic.  Right Ear: External ear normal.  Left Ear: External ear normal.  Eyes: Conjunctivae are normal. No scleral icterus.  Neck: Normal range of motion. Neck supple. No tracheal deviation present. No thyromegaly present.  Cardiovascular: Normal rate, regular rhythm and normal heart sounds.   Pulmonary/Chest: Effort normal and breath sounds normal. No respiratory distress. She has no wheezes. Right breast exhibits tenderness. Right breast exhibits no inverted nipple, no nipple discharge and no skin change. Left breast exhibits no inverted nipple, no nipple discharge, no skin change and no tenderness. Breasts are symmetrical.         Right breast- old horizontal incision in Rt lower outer quad at about 9 o'clock about 3cm from NAC. Some mild TTP in rt outer lower quad. No bruising. Small palpable lump at 8 o'clock position just behind NAC - ? hematoma  Abdominal: Soft. Bowel sounds are normal. She exhibits no distension.  Musculoskeletal: Normal range of motion. She exhibits no edema and no tenderness.  Lymphadenopathy:    She has no cervical adenopathy.    She has no axillary adenopathy.       Right: No supraclavicular adenopathy present.       Left: No supraclavicular adenopathy present.  Neurological: She is alert and oriented to person, place, and time. She exhibits normal muscle tone.  Skin: Skin is warm and dry. No rash noted.  She is not diaphoretic. No erythema.  Psychiatric: She has a normal mood and affect. Her behavior is normal. Judgment and thought content normal.    Data Reviewed Mammogram images and report. Biopsy report and path report  Pathology report: Right breast needle core biopsy-complex sclerosing lesion. No malignant epithelial features present. The lesion is best possible for as a complex sclerosing lesion-radial scar.  Mammogram screening January 19 Compared to previous, on the right there is a potential abnormality, clustered microcalcifications in linear orientation centrally. There is been no significant interval change on the left. BI-RADS zero  Mammogram unilateral right-sided January 29 Additional imaging of the right breast shows no suspicious mass, asymmetry or architectural distortion. A cluster of calcifications in linear distribution is located at about 6:00 mid-depth position. The calcifications are dystrophic in appearance and probably benign.  Stereotactic vacuum assisted core biopsy of the right breast February 28 The area in question in the right breast was identified  and ILM approach was selected. The breast was prepped. The vacuum-assisted 10-gauge core biopsy needle was introduced. 5 cores were obtained. The tissue marker was deployed into the biopsy cavity.  Assessment    51 year old Caucasian female with a right breast radial scar-complex sclerosing lesion    Plan    We reviewed her mammograms. I reviewed her pathology report with her. We discussed the etiology and management of radial scar. Based on the biopsy results and her family history, I have recommended surgical biopsy. We discussed how a surgical biopsy would be performed-with a needle localized guidewire.  We discussed the risk and benefits of surgery including but not limited to bleeding, infection, hematoma formation, seroma formation, need for additional procedures depending on the pathology report,  anesthesia risk, blood clot formation, and cosmetic concerns such as dimpling. We discussed the typical postop course.  We will schedule her for a right breast needle localized breast biopsy in the near future  Jasiya Markie M. Andrey Campanile, MD, FACS General, Bariatric, & Minimally Invasive Surgery [facility] Surgery, [place]        [facility] M 02/27/2012, 10:27 AM

## 2012-03-19 NOTE — Anesthesia Procedure Notes (Signed)
Procedure Name: LMA Insertion Date/Time: 03/19/2012 9:31 AM Performed by: Zenia Resides D Pre-anesthesia Checklist: Patient identified, Emergency Drugs available, Suction available, Patient being monitored and Timeout performed Patient Re-evaluated:Patient Re-evaluated prior to inductionOxygen Delivery Method: Circle System Utilized Preoxygenation: Pre-oxygenation with 100% oxygen Intubation Type: IV induction Ventilation: Mask ventilation without difficulty LMA: LMA with gastric port inserted LMA Size: 4.0 Number of attempts: 1 Placement Confirmation: positive ETCO2 and breath sounds checked- equal and bilateral Tube secured with: Tape Dental Injury: Teeth and Oropharynx as per pre-operative assessment

## 2012-03-19 NOTE — Anesthesia Postprocedure Evaluation (Signed)
Anesthesia Post Note  Patient: Shari Ruiz  Procedure(s) Performed: Procedure(s) (LRB): BREAST BIOPSY WITH NEEDLE LOCALIZATION (Right)  Anesthesia type: General  Patient location: PACU  Post pain: Pain level controlled  Post assessment: Patient's Cardiovascular Status Stable  Last Vitals:  Filed Vitals:   03/19/12 1145  BP: 110/65  Pulse: 75  Temp:   Resp: 14    Post vital signs: Reviewed and stable  Level of consciousness: alert  Complications: No apparent anesthesia complications

## 2012-03-19 NOTE — Anesthesia Preprocedure Evaluation (Signed)
Anesthesia Evaluation  Patient identified by MRN, date of birth, ID band Patient awake    Reviewed: Allergy & Precautions, H&P , NPO status , Patient's Chart, lab work & pertinent test results, reviewed documented beta blocker date and time   Airway Mallampati: II TM Distance: >3 FB Neck ROM: full    Dental   Pulmonary pneumonia ,          Cardiovascular hypertension, - dysrhythmias     Neuro/Psych PSYCHIATRIC DISORDERS  Neuromuscular disease    GI/Hepatic Neg liver ROS, GERD-  Medicated and Controlled,  Endo/Other  negative endocrine ROS  Renal/GU negative Renal ROS  negative genitourinary   Musculoskeletal   Abdominal   Peds  Hematology negative hematology ROS (+)   Anesthesia Other Findings See surgeon's H&P   Reproductive/Obstetrics negative OB ROS                           Anesthesia Physical Anesthesia Plan  ASA: II  Anesthesia Plan: General   Post-op Pain Management:    Induction: Intravenous  Airway Management Planned: LMA  Additional Equipment:   Intra-op Plan:   Post-operative Plan: Extubation in OR  Informed Consent: I have reviewed the patients History and Physical, chart, labs and discussed the procedure including the risks, benefits and alternatives for the proposed anesthesia with the patient or authorized representative who has indicated his/her understanding and acceptance.     Plan Discussed with: CRNA and Surgeon  Anesthesia Plan Comments:         Anesthesia Quick Evaluation

## 2012-03-20 ENCOUNTER — Telehealth (INDEPENDENT_AMBULATORY_CARE_PROVIDER_SITE_OTHER): Payer: Self-pay | Admitting: General Surgery

## 2012-03-20 ENCOUNTER — Encounter (HOSPITAL_BASED_OUTPATIENT_CLINIC_OR_DEPARTMENT_OTHER): Payer: Self-pay | Admitting: General Surgery

## 2012-03-20 LAB — POCT HEMOGLOBIN-HEMACUE: Hemoglobin: 12.4 g/dL (ref 12.0–15.0)

## 2012-03-20 NOTE — Op Note (Signed)
NAME:  Shari Ruiz, Shari Ruiz                     ACCOUNT NO.:  MEDICAL RECORD NO.:  [id number]  LOCATION:                                 FACILITY:  PHYSICIAN:  Mary Sella. Andrey Campanile, MD, FACSDATE OF BIRTH:  Dec 25, 1960  DATE OF PROCEDURE:  03/19/2012 DATE OF DISCHARGE:                              OPERATIVE REPORT   PREOPERATIVE DIAGNOSIS:  Right breast complex sclerosing lesion-radial scar.  POSTOPERATIVE DIAGNOSIS:  Right breast complex sclerosing lesion-radial scar.  PROCEDURE:  Right breast needle localized biopsy.  SURGEON:  Mary Sella. Andrey Campanile, MD, FACS  ASSISTANT SURGEON:  None.  ANESTHESIA:  General with LMA plus 0.25% Marcaine with epi - 30 mL.  ESTIMATED BLOOD LOSS:  Minimal.  INDICATIONS FOR PROCEDURE:  The patient is a very pleasant 51 year old Caucasian female who had a routine mammogram, which demonstrated an area of abnormality in her right breast.  She underwent additional views of it, which confirmed an area of mammographic abnormality with calcifications.  She underwent a core biopsy, which demonstrated a complex sclerosing lesion consistent with a radial scar.  Based on this, we discussed several options, such as observation versus surgical excisional biopsy.  We discussed the risks and benefits of surgery including, but not limited to, bleeding, infection, injury to surrounding structures, nipple sensitivity decreased, hematoma formation, seroma formation, cosmesis concerns, potential need for additional procedures, blood clot formation, as well as anesthesia risks.  We also discussed typical postoperative course.  The patient elected to proceed with surgery.  DESCRIPTION OF PROCEDURE:  On the morning of surgery, the patient went to the Radiology Center and had a needle localized wire placed into the area of concern.  She was then brought to the [facility].  The right side was identified and initialed by me with the patient confirming the  operative site.  After informed consent was obtained, she was taken back to the operating room #8 at [facility] and placed supine on the operating table.  Sequential compression devices were placed.  General LMA anesthesia was established.  Her right breast was prepped and draped with ChloraPrep after the guidewire had been trimmed slightly.  In reviewing the mammographs taken today after the guidewire had been placed, I planned my incision to be halfway between the nipple-areolar complex and where the guidewire, which was at about the 7 o'clock position about 5 cm from the nipple.  I made a curvilinear incision with a #15 blade from the 6 o'clock to 8 o'clock position.  I divided the deep dermis with electrocautery.  I then dissected inferolaterally and identified the guidewire and delivered into the incision.  I then placed an Allis clamp onto the tissue into which the guidewire dove.  I then circumferentially cored out the surrounding breast tissue with Bovie electrocautery.  Near the tip of the guidewire, I took my dissection out laterally slightly increasing the amount of breast tissue I was incorporating.  The specimen was removed from the breast cavity, marked it with the sutures as described above.  The specimen radiograph was obtained, which demonstrated that the previous biopsy marker was included in the specimen.  Radiology  agreed.  We then irrigated the cavity.  Hemostasis was achieved.  I then brought together some of the deep breast tissue with interrupted 3-0 Vicryl sutures for oncoplastic purposes.  The deep dermis was then reapproximated with inverted interrupted 3-0 Vicryl sutures and the skin was reapproximated with a running 4-0 Monocryl in a subcuticular fashion followed by application of benzoin, Steri-Strips, 4 x 4, and Tegaderm.  It should be noted that prior to closure, I did inject 30 mL of 0.25% Marcaine with epi in a local fashion for regional  anesthesia.  The patient tolerated the procedure well.  There were no immediate complications.  She was extubated and taken to the recovery room in stable condition.     Mary Sella. Andrey Campanile, MD, FACS     EMW/MEDQ  D:  03/19/2012  T:  03/19/2012  Job:  [id number]

## 2012-03-20 NOTE — Telephone Encounter (Signed)
Attempted to call pt to inform her of benign breast path report - both numbers tried, no answer. Will have nurse call on monday

## 2012-03-23 ENCOUNTER — Telehealth (INDEPENDENT_AMBULATORY_CARE_PROVIDER_SITE_OTHER): Payer: Self-pay

## 2012-03-23 NOTE — Telephone Encounter (Signed)
Pt notified her path report to be benign per Dr Andrey Campanile.

## 2012-03-24 ENCOUNTER — Encounter (HOSPITAL_BASED_OUTPATIENT_CLINIC_OR_DEPARTMENT_OTHER): Payer: Self-pay

## 2012-03-26 ENCOUNTER — Ambulatory Visit (INDEPENDENT_AMBULATORY_CARE_PROVIDER_SITE_OTHER): Payer: BC Managed Care – PPO | Admitting: General Surgery

## 2012-03-26 ENCOUNTER — Encounter (INDEPENDENT_AMBULATORY_CARE_PROVIDER_SITE_OTHER): Payer: Self-pay | Admitting: General Surgery

## 2012-03-26 VITALS — BP 132/70 | HR 80 | Resp 16 | Ht 66.0 in | Wt 192.0 lb

## 2012-03-26 DIAGNOSIS — Z09 Encounter for follow-up examination after completed treatment for conditions other than malignant neoplasm: Secondary | ICD-10-CM

## 2012-03-26 NOTE — Progress Notes (Signed)
Chief complaint:   postop Procedure: Status post right breast needle localized biopsy April 11  History of Present Ilness: 51 year old Caucasian female comes in for followup appointment. She states that she has done relatively well since surgery. She states that she has the typical soreness in her right breast. She denies any fevers chills. She denies any redness around her incision. She denies any drainage from her incision. She thinks that maybe the skin incision has separated slightly. She also has some soreness in her right axilla.  Physical Exam: Well-developed, well-nourished Caucasian female in no apparent distress Right breast-healing lower outer quadrant incision. There is some scattered bruising in that quadrant. No cellulitis. Skin incision was closed. No lymphedema in the right arm. Good strength in right upper extremity.  Pathology: Extensive fibrocystic changes with intraductal papillomas, apocrine metaplasia, stromal fibrosis. No atypia or malignancy. Margins negative.   Assessment and Plan: Status post right breast needle localized biopsy.  Her Steri-Strip came off when I removed her occlusive dressing that she had placed. We replaced it with a new Steri-Strip.  We discussed her pathology report. She was given a copy of that. I informed her she needed no additional surgery. She will need to resume normal mammograms in the future. I'll see her back in 5 weeks to make sure the area is completely healed.  Shari Ruiz. Andrey Campanile, MD, FACS General, Bariatric, & Minimally Invasive Surgery [facility], [place]

## 2012-04-16 ENCOUNTER — Encounter (INDEPENDENT_AMBULATORY_CARE_PROVIDER_SITE_OTHER): Payer: Self-pay

## 2012-04-17 ENCOUNTER — Encounter (INDEPENDENT_AMBULATORY_CARE_PROVIDER_SITE_OTHER): Payer: Self-pay

## 2012-05-05 ENCOUNTER — Encounter (INDEPENDENT_AMBULATORY_CARE_PROVIDER_SITE_OTHER): Payer: Self-pay | Admitting: General Surgery

## 2012-05-05 ENCOUNTER — Ambulatory Visit (INDEPENDENT_AMBULATORY_CARE_PROVIDER_SITE_OTHER): Payer: BC Managed Care – PPO | Admitting: General Surgery

## 2012-05-05 VITALS — BP 118/84 | HR 60 | Temp 97.4°F | Resp 14 | Ht 66.0 in | Wt 197.8 lb

## 2012-05-05 DIAGNOSIS — Z09 Encounter for follow-up examination after completed treatment for conditions other than malignant neoplasm: Secondary | ICD-10-CM

## 2012-05-05 NOTE — Progress Notes (Signed)
Chief complaint:   postop  Procedure: Status post right breast needle localized biopsy April 11  History of Present Ilness: 51 year old Caucasian female comes in for her 2nd followup appointment. She states that she has done relatively well since her last appointment. The soreness in her right breast has improved and essentially resolved. She denies any fevers chills. She denies any redness around her incision. She denies any drainage from her incision. She states the soreness in her right axilla is still present. She also complains of numbness in her RUE, mainly proximal extremity. She also c/o some burning sensation in her axilla radiating into her right breast. The more she uses her right arm the more discomfort she has.   Physical Exam: Well-developed, well-nourished Caucasian female in no apparent distress Right breast-healed lower outer quadrant incision. No bruising in that quadrant. No cellulitis. Skin incision was closed. No lymphedema in the right arm. Good strength in right upper extremity. No gross defect in sensation or strength appreciated.   Pathology: Extensive fibrocystic changes with intraductal papillomas, apocrine metaplasia, stromal fibrosis. No atypia or malignancy. Margins negative.   Assessment and Plan: Status post right breast needle localized biopsy.  Her incision is well healed. The numbness and burning sensation in her RUE sounds more nerve related. We did not do any dissecting in her axilla. This seems more consistent with maybe a stretch of her RUE during surgery. We discussed several options. My first preference is too give it some more time to see if it improves on its own.   F/u 6 weeks  Mary Sella. Andrey Campanile, MD, FACS General, Bariatric, & Minimally Invasive Surgery [facility], [place]

## 2012-06-19 ENCOUNTER — Encounter (INDEPENDENT_AMBULATORY_CARE_PROVIDER_SITE_OTHER): Payer: BC Managed Care – PPO | Admitting: General Surgery

## 2012-06-23 ENCOUNTER — Encounter (INDEPENDENT_AMBULATORY_CARE_PROVIDER_SITE_OTHER): Payer: Self-pay | Admitting: General Surgery

## 2013-10-13 ENCOUNTER — Telehealth: Payer: Self-pay | Admitting: Emergency Medicine

## 2013-10-13 DIAGNOSIS — R05 Cough: Secondary | ICD-10-CM

## 2013-10-13 DIAGNOSIS — A499 Bacterial infection, unspecified: Secondary | ICD-10-CM

## 2013-10-13 MED ORDER — ALBUTEROL SULFATE HFA 108 (90 BASE) MCG/ACT IN AERS: 2.0000 | INHALATION_SPRAY | Freq: Four times a day (QID) | RESPIRATORY_TRACT | Status: DC | PRN

## 2013-10-13 MED ORDER — LEVOFLOXACIN 500 MG PO TABS: 500.0000 mg | ORAL_TABLET | Freq: Every day | ORAL | Status: DC

## 2013-10-13 NOTE — Telephone Encounter (Signed)
Patient still ill will call in Levaquin 500mg  1 PO qD #10 and Alb HFA inhaler 1-2puffs q4hours/ prn.

## 2013-10-13 NOTE — Telephone Encounter (Signed)
Shari Ruiz pt still sick=saw you a wk ago=very weak=bad cough=just not herself=wants appt today if possible=sending the chart to you.

## 2013-10-18 ENCOUNTER — Telehealth: Payer: Self-pay | Admitting: Emergency Medicine

## 2013-10-18 ENCOUNTER — Ambulatory Visit (HOSPITAL_COMMUNITY)
Admission: RE | Admit: 2013-10-18 | Discharge: 2013-10-18 | Disposition: A | Discharge status: Home / Self Care | Payer: BC Managed Care – PPO | Source: Ambulatory Visit | Attending: Emergency Medicine | Admitting: Emergency Medicine

## 2013-10-18 DIAGNOSIS — J189 Pneumonia, unspecified organism: Secondary | ICD-10-CM | POA: Insufficient documentation

## 2013-10-18 DIAGNOSIS — R05 Cough: Secondary | ICD-10-CM | POA: Insufficient documentation

## 2013-10-18 DIAGNOSIS — R5381 Other malaise: Secondary | ICD-10-CM | POA: Insufficient documentation

## 2013-10-18 DIAGNOSIS — R059 Cough, unspecified: Secondary | ICD-10-CM | POA: Insufficient documentation

## 2013-10-18 DIAGNOSIS — R079 Chest pain, unspecified: Secondary | ICD-10-CM | POA: Insufficient documentation

## 2013-10-18 DIAGNOSIS — R062 Wheezing: Secondary | ICD-10-CM | POA: Insufficient documentation

## 2013-10-18 NOTE — Telephone Encounter (Signed)
Pt called=treated for pneumonia -some better-still wheezing,weak,unable to work. Feels very slow-still has cough-been on abx for 4 days. Finished prednisone=wants advice Please call. Sending chart back to [facility]

## 2013-10-18 NOTE — Telephone Encounter (Signed)
COntinue medicines the same. [facility] Can extend work note to return [facility].

## 2013-10-19 ENCOUNTER — Other Ambulatory Visit: Payer: Self-pay | Admitting: Emergency Medicine

## 2013-10-19 DIAGNOSIS — R05 Cough: Secondary | ICD-10-CM

## 2013-10-19 MED ORDER — HYDROCOD POLST-CHLORPHEN POLST 10-8 MG/5ML PO LQCR: 5.0000 mL | Freq: Two times a day (BID) | ORAL | Status: DC

## 2013-10-20 ENCOUNTER — Other Ambulatory Visit: Payer: Self-pay | Admitting: Emergency Medicine

## 2013-11-21 ENCOUNTER — Encounter: Payer: Self-pay | Admitting: Internal Medicine

## 2013-11-21 DIAGNOSIS — I10 Essential (primary) hypertension: Secondary | ICD-10-CM

## 2013-11-21 DIAGNOSIS — F329 Major depressive disorder, single episode, unspecified: Secondary | ICD-10-CM

## 2013-11-21 DIAGNOSIS — M1990 Unspecified osteoarthritis, unspecified site: Secondary | ICD-10-CM | POA: Insufficient documentation

## 2013-11-21 DIAGNOSIS — F32A Depression, unspecified: Secondary | ICD-10-CM

## 2013-11-21 DIAGNOSIS — K219 Gastro-esophageal reflux disease without esophagitis: Secondary | ICD-10-CM

## 2013-11-24 ENCOUNTER — Encounter: Payer: Self-pay | Admitting: Emergency Medicine

## 2013-12-10 ENCOUNTER — Ambulatory Visit: Payer: Self-pay | Admitting: Emergency Medicine

## 2013-12-21 ENCOUNTER — Encounter: Payer: Self-pay | Admitting: Physician Assistant

## 2013-12-21 ENCOUNTER — Ambulatory Visit (INDEPENDENT_AMBULATORY_CARE_PROVIDER_SITE_OTHER): Payer: BC Managed Care – PPO | Admitting: Physician Assistant

## 2013-12-21 VITALS — BP 118/72 | HR 100 | Temp 98.6°F | Resp 16 | Ht 65.0 in | Wt 208.0 lb

## 2013-12-21 DIAGNOSIS — B373 Candidiasis of vulva and vagina: Secondary | ICD-10-CM

## 2013-12-21 DIAGNOSIS — J209 Acute bronchitis, unspecified: Secondary | ICD-10-CM

## 2013-12-21 DIAGNOSIS — B3731 Acute candidiasis of vulva and vagina: Secondary | ICD-10-CM

## 2013-12-21 MED ORDER — NYSTATIN 100000 UNIT/GM EX CREA: 1.0000 "application " | TOPICAL_CREAM | Freq: Two times a day (BID) | CUTANEOUS | Status: DC

## 2013-12-21 MED ORDER — PROMETHAZINE-CODEINE 6.25-10 MG/5ML PO SYRP: 5.0000 mL | ORAL_SOLUTION | Freq: Four times a day (QID) | ORAL | Status: DC | PRN

## 2013-12-21 MED ORDER — DEXAMETHASONE SODIUM PHOSPHATE 100 MG/10ML IJ SOLN
10.0000 mg | Freq: Once | INTRAMUSCULAR | Status: AC
  Administered 2013-12-21: 10 mg via INTRAMUSCULAR

## 2013-12-21 MED ORDER — DOXYCYCLINE HYCLATE 100 MG PO TABS: 100.0000 mg | ORAL_TABLET | Freq: Two times a day (BID) | ORAL | Status: DC

## 2013-12-21 MED ORDER — UMECLIDINIUM-VILANTEROL 62.5-25 MCG/INH IN AEPB: INHALATION_SPRAY | RESPIRATORY_TRACT | Status: DC

## 2013-12-21 MED ORDER — FLUCONAZOLE 150 MG PO TABS: 150.0000 mg | ORAL_TABLET | Freq: Every day | ORAL | Status: DC

## 2013-12-21 MED ORDER — PREDNISONE 20 MG PO TABS: ORAL_TABLET | ORAL | Status: DC

## 2013-12-21 MED ORDER — CEFTRIAXONE SODIUM 1 G IJ SOLR
1.0000 g | Freq: Once | INTRAMUSCULAR | Status: AC
  Administered 2013-12-21: 1 g via INTRAMUSCULAR

## 2013-12-21 MED ORDER — ALPRAZOLAM 0.5 MG PO TABS: ORAL_TABLET | ORAL | Status: DC

## 2013-12-21 MED ORDER — IPRATROPIUM-ALBUTEROL 0.5-2.5 (3) MG/3ML IN SOLN
3.0000 mL | Freq: Once | RESPIRATORY_TRACT | Status: AC
  Administered 2013-12-21: 3 mL via RESPIRATORY_TRACT

## 2013-12-21 NOTE — Progress Notes (Signed)
   Subjective:    Patient ID: Shari Ruiz, female    DOB: 12/09/1961, 53 y.o.   MRN: [id number]  Cough This is a new problem. Episode onset: Was treated for pneumonia in Nov, then right at [facility] she got sick again. treated Augmentin and prednisone shot and no help. The problem has been gradually worsening. The problem occurs constantly. The cough is non-productive. Associated symptoms include chills, rhinorrhea, shortness of breath and wheezing. Pertinent negatives include no chest pain, ear congestion, ear pain, fever or sore throat.  Denies swelling in feet, recent travel.   Review of Systems  Constitutional: Positive for chills and fatigue. Negative for fever.  HENT: Positive for congestion, rhinorrhea, sinus pressure and sneezing. Negative for ear pain and sore throat.   Eyes: Negative.   Respiratory: Positive for cough, chest tightness, shortness of breath and wheezing. Negative for stridor.   Cardiovascular: Negative for chest pain, palpitations and leg swelling.  Gastrointestinal: Negative.   Genitourinary: Positive for vaginal pain (yeast from all the ABX).  Musculoskeletal: Negative.   Neurological: Negative.       Objective:   Physical Exam  Vitals reviewed. Constitutional: She is oriented to person, place, and time. She appears well-developed and well-nourished.  HENT:  Head: Normocephalic and atraumatic.  Right Ear: External ear normal.  Left Ear: External ear normal.  Nose: Nose normal.  Mouth/Throat: Oropharynx is clear and moist.  Eyes: Conjunctivae are normal. Pupils are equal, round, and reactive to light.  Neck: Normal range of motion. Neck supple.  Cardiovascular: Normal rate and regular rhythm.   Pulmonary/Chest: Effort normal. No respiratory distress. She has wheezes. She has no rales. She exhibits no tenderness.  Abdominal: Soft. Bowel sounds are normal.  Lymphadenopathy:    She has no cervical adenopathy.  Neurological: She is alert and oriented to  person, place, and time.  Skin: Skin is warm and dry.      Assessment & Plan:  Acute bronchitis - Plan: promethazine-codeine (PHENERGAN WITH CODEINE) 6.25-10 MG/5ML syrup, predniSONE (DELTASONE) 20 MG tablet, dexamethasone (DECADRON) injection 10 mg, ipratropium-albuterol (DUONEB) 0.5-2.5 (3) MG/3ML nebulizer solution 3 mL, doxycycline (VIBRA-TABS) 100 MG tablet, ALPRAZolam (XANAX) 0.5 MG tablet, cefTRIAXone (ROCEPHIN) injection 1 g, Umeclidinium-Vilanterol (ANORO ELLIPTA) 62.5-25 MCG/INH AEPB  Yeast- diflucan, nystatin  If it gets worse go to the ER

## 2013-12-21 NOTE — Patient Instructions (Signed)

## 2013-12-30 ENCOUNTER — Encounter: Payer: Self-pay | Admitting: Physician Assistant

## 2013-12-30 ENCOUNTER — Ambulatory Visit (INDEPENDENT_AMBULATORY_CARE_PROVIDER_SITE_OTHER): Payer: BC Managed Care – PPO | Admitting: Physician Assistant

## 2013-12-30 VITALS — BP 138/80 | HR 100 | Temp 98.1°F | Resp 16 | Wt 221.0 lb

## 2013-12-30 DIAGNOSIS — I10 Essential (primary) hypertension: Secondary | ICD-10-CM

## 2013-12-30 DIAGNOSIS — R5381 Other malaise: Secondary | ICD-10-CM

## 2013-12-30 DIAGNOSIS — R5383 Other fatigue: Secondary | ICD-10-CM

## 2013-12-30 DIAGNOSIS — R0602 Shortness of breath: Secondary | ICD-10-CM

## 2013-12-30 DIAGNOSIS — J209 Acute bronchitis, unspecified: Secondary | ICD-10-CM

## 2013-12-30 DIAGNOSIS — R079 Chest pain, unspecified: Secondary | ICD-10-CM

## 2013-12-30 LAB — CBC WITH DIFFERENTIAL/PLATELET
BASOS ABS: 0.1 10*3/uL (ref 0.0–0.1)
BASOS PCT: 0 % (ref 0–1)
EOS ABS: 0.3 10*3/uL (ref 0.0–0.7)
Eosinophils Relative: 2 % (ref 0–5)
HCT: 41.7 % (ref 36.0–46.0)
HEMOGLOBIN: 13.9 g/dL (ref 12.0–15.0)
Lymphocytes Relative: 44 % (ref 12–46)
Lymphs Abs: 5.5 10*3/uL — ABNORMAL HIGH (ref 0.7–4.0)
MCH: 30.8 pg (ref 26.0–34.0)
MCHC: 33.3 g/dL (ref 30.0–36.0)
MCV: 92.3 fL (ref 78.0–100.0)
MONOS PCT: 11 % (ref 3–12)
Monocytes Absolute: 1.4 10*3/uL — ABNORMAL HIGH (ref 0.1–1.0)
NEUTROS PCT: 43 % (ref 43–77)
Neutro Abs: 5.4 10*3/uL (ref 1.7–7.7)
PLATELETS: 266 10*3/uL (ref 150–400)
RBC: 4.52 MIL/uL (ref 3.87–5.11)
RDW: 15 % (ref 11.5–15.5)
WBC: 12.7 10*3/uL — ABNORMAL HIGH (ref 4.0–10.5)

## 2013-12-30 MED ORDER — PROMETHAZINE-CODEINE 6.25-10 MG/5ML PO SYRP: 5.0000 mL | ORAL_SOLUTION | Freq: Four times a day (QID) | ORAL | Status: DC | PRN

## 2013-12-30 NOTE — Progress Notes (Signed)
   Subjective:    Patient ID: Shari Ruiz, female    DOB: [birth year], 53 y.o.   MRN: [id number]  HPI 53 y.o. female with history of smoking history follows up with a 3 month history of shortness of breath. She had a CXR in nov showing bronchitic changes, she has had several rounds of ABX (doxy, Augmentin) and prednisone. She was most recently seen 12/21/2012 and states she felt a little better but she continues to have extreme fatigue, cough, SOB with stairs, denies fever, chills, and very thin productive mucus. Cough medicine helps and albuterol has helped. + night sweats She had one episode of very localized left sided chest pain for 30 mins, took antacids, no radiation, no accompaniments. States she has strong family history of autoimmune disease and "wants to get tested" since her body "does not fight off infection."   Review of Systems  Constitutional: Positive for fever, chills, diaphoresis and fatigue. Negative for activity change, appetite change and unexpected weight change.  HENT: Negative.   Eyes: Negative.   Respiratory: Positive for cough, chest tightness, shortness of breath and wheezing.   Cardiovascular: Positive for chest pain. Negative for palpitations and leg swelling.  Gastrointestinal: Negative.   Genitourinary: Negative.   Musculoskeletal: Negative.        Objective:   Physical Exam  Constitutional: She is oriented to person, place, and time. She appears well-developed and well-nourished.  HENT:  Head: Normocephalic and atraumatic.  Eyes: Conjunctivae and EOM are normal. Pupils are equal, round, and reactive to light.  Neck: Normal range of motion. Neck supple. No thyromegaly present.  Cardiovascular: Regular rhythm and normal pulses.  Tachycardia present.   Pulmonary/Chest: Effort normal. She has decreased breath sounds. She has wheezes.  Abdominal: Soft. Bowel sounds are normal. There is no tenderness.  Musculoskeletal: Normal range of motion.   Lymphadenopathy:    She has no cervical adenopathy.  Neurological: She is alert and oriented to person, place, and time.  Skin: Skin is warm and dry.       Assessment & Plan:  SOB with history of smoking? COPD/rule out cancer- will get CT chest rule out PE, mass, cancer, send home Nebulizer, send referral to Pulmonary for PFTs.  Chest pain- atypical likely pulmonary or GERD- however history of smoking, family history of heart problems, will refer to cardiology to rule out valvular disorder/CAD.   Anxiety- continue medications SOB/joint pain/family history of autoimmune- patient is very insistent on getting testing- symptoms are low risk but we will get minimal autoimmune labs.   OVER 40 minutes of exam, counseling, chart review, referral performed

## 2013-12-30 NOTE — Patient Instructions (Signed)
Shortness of Breath Shortness of breath means you have trouble breathing. Shortness of breath may indicate that you have a medical problem. You should seek immediate medical care for shortness of breath. CAUSES   Not enough oxygen in the air (as with high altitudes or a smoke-filled room).  Short-term (acute) lung disease, including:  Infections, such as pneumonia.  Fluid in the lungs, such as heart failure.  A blood clot in the lungs (pulmonary embolism).  Long-term (chronic) lung diseases.  Heart disease (heart attack, angina, heart failure, and others).  Low red blood cells (anemia).  Poor physical fitness. This can cause shortness of breath when you exercise.  Chest or back injuries or stiffness.  Being overweight.  Smoking.  Anxiety. This can make you feel like you are not getting enough air. DIAGNOSIS  Serious medical problems can usually be found during your physical exam. Tests may also be done to determine why you are having shortness of breath. Tests may include:  Chest X-rays.  Lung function tests.  Blood tests.  Electrocardiography.  Exercise testing.  Echocardiography.  Imaging scans. Your caregiver may not be able to find a cause for your shortness of breath after your exam. In this case, it is important to have a follow-up exam with your caregiver as directed.  TREATMENT  Treatment for shortness of breath depends on the cause of your symptoms and can vary greatly. HOME CARE INSTRUCTIONS   Do not smoke. Smoking is a common cause of shortness of breath. If you smoke, ask for help to quit.  Avoid being around chemicals or things that may bother your breathing, such as paint fumes and dust.  Rest as needed. Slowly resume your usual activities.  If medicines were prescribed, take them as directed for the full length of time directed. This includes oxygen and any inhaled medicines.  Keep all follow-up appointments as directed by your caregiver. SEEK  MEDICAL CARE IF:   Your condition does not improve in the time expected.  You have a hard time doing your normal activities even with rest.  You have any side effects or problems with the medicines prescribed.  You develop any new symptoms. SEEK IMMEDIATE MEDICAL CARE IF:   Your shortness of breath gets worse.  You feel lightheaded, faint, or develop a cough not controlled with medicines.  You start coughing up blood.  You have pain with breathing.  You have chest pain or pain in your arms, shoulders, or abdomen.  You have a fever.  You are unable to walk up stairs or exercise the way you normally do. MAKE SURE YOU:  Understand these instructions.  Will watch your condition.  Will get help right away if you are not doing well or get worse. Document Released: 08/20/2001 Document Revised: 05/26/2012 Document Reviewed: 02/10/2012 [facility] Patient Information 2014 [place]. Chest Pain (Nonspecific) It is often hard to give a specific diagnosis for the cause of chest pain. There is always a chance that your pain could be related to something serious, such as a heart attack or a blood clot in the lungs. You need to follow up with your caregiver for further evaluation. CAUSES   Heartburn.  Pneumonia or bronchitis.  Anxiety or stress.  Inflammation around your heart (pericarditis) or lung (pleuritis or pleurisy).  A blood clot in the lung.  A collapsed lung (pneumothorax). It can develop suddenly on its own (spontaneous pneumothorax) or from injury (trauma) to the chest.  Shingles infection (herpes zoster virus). The chest  wall is composed of bones, muscles, and cartilage. Any of these can be the source of the pain.  The bones can be bruised by injury.  The muscles or cartilage can be strained by coughing or overwork.  The cartilage can be affected by inflammation and become sore (costochondritis). DIAGNOSIS  Lab tests or other studies, such as X-rays,  electrocardiography, stress testing, or cardiac imaging, may be needed to find the cause of your pain.  TREATMENT   Treatment depends on what may be causing your chest pain. Treatment may include:  Acid blockers for heartburn.  Anti-inflammatory medicine.  Pain medicine for inflammatory conditions.  Antibiotics if an infection is present.  You may be advised to change lifestyle habits. This includes stopping smoking and avoiding alcohol, caffeine, and chocolate.  You may be advised to keep your head raised (elevated) when sleeping. This reduces the chance of acid going backward from your stomach into your esophagus.  Most of the time, nonspecific chest pain will improve within 2 to 3 days with rest and mild pain medicine. HOME CARE INSTRUCTIONS   If antibiotics were prescribed, take your antibiotics as directed. Finish them even if you start to feel better.  For the next few days, avoid physical activities that bring on chest pain. Continue physical activities as directed.  Do not smoke.  Avoid drinking alcohol.  Only take over-the-counter or prescription medicine for pain, discomfort, or fever as directed by your caregiver.  Follow your caregiver's suggestions for further testing if your chest pain does not go away.  Keep any follow-up appointments you made. If you do not go to an appointment, you could develop lasting (chronic) problems with pain. If there is any problem keeping an appointment, you must call to reschedule. SEEK MEDICAL CARE IF:   You think you are having problems from the medicine you are taking. Read your medicine instructions carefully.  Your chest pain does not go away, even after treatment.  You develop a rash with blisters on your chest. SEEK IMMEDIATE MEDICAL CARE IF:   You have increased chest pain or pain that spreads to your arm, neck, jaw, back, or abdomen.  You develop shortness of breath, an increasing cough, or you are coughing up  blood.  You have severe back or abdominal pain, feel nauseous, or vomit.  You develop severe weakness, fainting, or chills.  You have a fever. THIS IS AN EMERGENCY. Do not wait to see if the pain will go away. Get medical help at once. Call your local emergency services (911 in [place] Do not drive yourself to the hospital. MAKE SURE YOU:   Understand these instructions.  Will watch your condition.  Will get help right away if you are not doing well or get worse. Document Released: 09/04/2005 Document Revised: 02/17/2012 Document Reviewed: 06/30/2008 [facility] Patient Information 2014 [place].

## 2013-12-31 LAB — HEPATIC FUNCTION PANEL
ALT: 30 U/L (ref 0–35)
AST: 17 U/L (ref 0–37)
Albumin: 3.8 g/dL (ref 3.5–5.2)
Alkaline Phosphatase: 78 U/L (ref 39–117)
Bilirubin, Direct: 0.1 mg/dL (ref 0.0–0.3)
Indirect Bilirubin: 0.3 mg/dL (ref 0.0–0.9)
Total Bilirubin: 0.4 mg/dL (ref 0.3–1.2)
Total Protein: 6.3 g/dL (ref 6.0–8.3)

## 2013-12-31 LAB — RHEUMATOID FACTOR: Rhuematoid fact SerPl-aCnc: 11 IU/mL (ref <=14)

## 2013-12-31 LAB — TSH: TSH: 2.632 u[IU]/mL (ref 0.350–4.500)

## 2013-12-31 LAB — BASIC METABOLIC PANEL WITH GFR
BUN: 20 mg/dL (ref 6–23)
CALCIUM: 8.7 mg/dL (ref 8.4–10.5)
CO2: 28 mEq/L (ref 19–32)
Chloride: 104 mEq/L (ref 96–112)
Creat: 0.95 mg/dL (ref 0.50–1.10)
GFR, EST AFRICAN AMERICAN: 79 mL/min
GFR, Est Non African American: 69 mL/min
Glucose, Bld: 86 mg/dL (ref 70–99)
POTASSIUM: 4.4 meq/L (ref 3.5–5.3)
Sodium: 140 mEq/L (ref 135–145)

## 2013-12-31 LAB — ANTI-DNA ANTIBODY, DOUBLE-STRANDED: ds DNA Ab: <1 IU/mL

## 2013-12-31 LAB — ANA: ANA: NEGATIVE

## 2013-12-31 LAB — SEDIMENTATION RATE: SED RATE: 4 mm/h (ref 0–22)

## 2014-01-03 ENCOUNTER — Inpatient Hospital Stay
Admission: RE | Admit: 2014-01-03 | Discharge: 2014-01-03 | Disposition: A | Discharge status: Home / Self Care | Payer: BC Managed Care – PPO | Source: Ambulatory Visit | Attending: Physician Assistant | Admitting: Physician Assistant

## 2014-01-03 ENCOUNTER — Other Ambulatory Visit: Payer: Self-pay

## 2014-01-03 MED ORDER — IPRATROPIUM-ALBUTEROL 0.5-2.5 (3) MG/3ML IN SOLN: 3.0000 mL | Freq: Three times a day (TID) | RESPIRATORY_TRACT | Status: DC | PRN

## 2014-01-05 ENCOUNTER — Other Ambulatory Visit: Payer: Self-pay

## 2014-01-05 DIAGNOSIS — R0602 Shortness of breath: Secondary | ICD-10-CM

## 2014-01-07 ENCOUNTER — Ambulatory Visit
Admission: RE | Admit: 2014-01-07 | Discharge: 2014-01-07 | Disposition: A | Discharge status: Home / Self Care | Payer: BC Managed Care – PPO | Source: Ambulatory Visit | Attending: Physician Assistant | Admitting: Physician Assistant

## 2014-01-07 DIAGNOSIS — R0602 Shortness of breath: Secondary | ICD-10-CM

## 2014-01-07 MED ORDER — IOHEXOL 300 MG/ML  SOLN
75.0000 mL | Freq: Once | INTRAMUSCULAR | Status: AC | PRN
  Administered 2014-01-07: 75 mL via INTRAVENOUS

## 2014-01-10 ENCOUNTER — Encounter: Payer: Self-pay | Admitting: Cardiology

## 2014-01-10 ENCOUNTER — Encounter: Payer: Self-pay | Admitting: *Deleted

## 2014-01-10 ENCOUNTER — Ambulatory Visit (INDEPENDENT_AMBULATORY_CARE_PROVIDER_SITE_OTHER): Payer: BC Managed Care – PPO | Admitting: Cardiology

## 2014-01-10 VITALS — BP 100/78 | HR 93 | Ht 65.0 in | Wt 218.0 lb

## 2014-01-10 DIAGNOSIS — I10 Essential (primary) hypertension: Secondary | ICD-10-CM

## 2014-01-10 DIAGNOSIS — R079 Chest pain, unspecified: Secondary | ICD-10-CM

## 2014-01-10 DIAGNOSIS — R0989 Other specified symptoms and signs involving the circulatory and respiratory systems: Secondary | ICD-10-CM

## 2014-01-10 DIAGNOSIS — R002 Palpitations: Secondary | ICD-10-CM

## 2014-01-10 DIAGNOSIS — R0600 Dyspnea, unspecified: Secondary | ICD-10-CM

## 2014-01-10 DIAGNOSIS — R0609 Other forms of dyspnea: Secondary | ICD-10-CM

## 2014-01-10 NOTE — Patient Instructions (Addendum)
Your physician recommends that you schedule a follow-up appointment in: [place] has requested that you have an echocardiogram. Echocardiography is a painless test that uses sound waves to create images of your heart. It provides your doctor with information about the size and shape of your heart and how well your heart's chambers and valves are working. This procedure takes approximately one hour. There are no restrictions for this procedure.   Your physician has requested that you have an exercise tolerance test. For further information please visit [email or web address]. Please also follow instruction sheet, as given.    Exercise Stress Electrocardiogram An exercise stress electrocardiogram is a test to check how blood flows to your heart. It is done to find areas of poor blood flow. You will need to walk on a treadmill for this test. The electrocardiogram will record your heartbeat when you are at rest and when you are exercising. BEFORE THE PROCEDURE  Do not have drinks with caffeine or foods with caffeine for 24 hours before the test, or as told by your doctor.  Follow your doctor's instructions about eating and drinking before the test.  Ask your doctor what medicines you should or should not take before the test. Take your medicines with water unless told by your doctor not to.  If you use an inhaler, bring it with you to the test.  Bring a snack to eat after the test.  Do not  smoke for 4 hours before the test.  Wear comfortable shoes and clothing. PROCEDURE  You will have patches put on your chest. Small areas of your chest may need to be shaved. Wires will be connected to the patches.  Your heart rate will be watched while you are resting and while you are exercising.  You will walk on the treadmill. The treadmill will slowly get faster to raise your heart rate.  The test will take about 1 2 hours. AFTER THE PROCEDURE  Your heart rate and  blood pressure will be watched after the test.  You may return to your normal diet, activities, and medicines or as told by your doctor. Document Released: 05/13/2008 Document Revised: 09/15/2013 Document Reviewed: 08/02/2013 [facility] Patient Information 2014 [place]

## 2014-01-10 NOTE — Progress Notes (Signed)
HPI: 53 year old female for evaluation of chest pain. No prior cardiac history. Chest CT in January of 2015 showed minimal atherosclerosis. There was minimal loculated pericardial fluid versus right pericardial cyst. Laboratories in January of 2015 showed normal hemoglobin, normal renal function and normal liver functions. TSH normal. Patient has had pneumonia and URI since October. She notes dyspnea on exertion but no orthopnea or PND. She has occasional mild pedal edema. Recently she has noticed dizziness with standing and improves with sitting. She has occasional flutters in her chest and when she awakens at night she feels her heart pounding. She also had an episode of chest pain. It was in the left upper chest and breast area. No radiation. No associated symptoms. Lasted 15 minutes and resolved. Cardiology is asked to evaluate.   Current Outpatient Prescriptions  Medication Sig Dispense Refill  . albuterol (PROVENTIL HFA;VENTOLIN HFA) 108 (90 BASE) MCG/ACT inhaler Inhale 2 puffs into the lungs every 6 (six) hours as needed for wheezing or shortness of breath.  1 Inhaler  2  . ALPRAZolam (XANAX) 0.5 MG tablet 1/2-1 tablet as needed three times a day  30 tablet  0  . hydrochlorothiazide (HYDRODIURIL) 25 MG tablet Take 25 mg by mouth daily.      Marland Kitchen ipratropium-albuterol (DUONEB) 0.5-2.5 (3) MG/3ML SOLN Take 3 mLs by nebulization 3 (three) times daily as needed.  360 mL  5  . loratadine (CLARITIN) 10 MG tablet Take 10 mg by mouth daily.      Marland Kitchen losartan (COZAAR) 100 MG tablet Take 50 mg by mouth daily.      . Magnesium 250 MG TABS Take 500 mg by mouth daily.      . Multiple Vitamins-Minerals (MULTIVITAMIN WITH MINERALS) tablet Take 1 tablet by mouth daily.      Marland Kitchen omeprazole (PRILOSEC) 20 MG capsule Take 20 mg by mouth daily.      . promethazine-codeine (PHENERGAN WITH CODEINE) 6.25-10 MG/5ML syrup Take 5 mLs by mouth every 6 (six) hours as needed for cough.  240 mL  0  . Umeclidinium-Vilanterol  (ANORO ELLIPTA) 62.5-25 MCG/INH AEPB 1 inhalation two times daily as needed.  60 each  0   No current facility-administered medications for this visit.    Allergies  Allergen Reactions  . Lisinopril     cough    Past Medical History  Diagnosis Date  . Hypertension   . Arthritis   . Breast fibroadenoma     right  . GERD (gastroesophageal reflux disease)   . Dysrhythmia     ??  PVC'S........ASYMPTOMATIC  . Pneumonia   . Depression     IN 2008, HUSBAND LOST HIS JOB...HAD TO MOVE OUT OF THE HOUSE    Past Surgical History  Procedure Laterality Date  . Breast lumpectomy      ON THE RIGHT  . Abdominal hysterectomy      FIBROIDS & ENDOMETRIOSIS  . Knee arthroscopy      RIGHT KNEE  . Cholecystectomy  01/09/2012    Procedure: LAPAROSCOPIC CHOLECYSTECTOMY;  Surgeon: Gayland Curry, MD;  Location: [place];  Service: General;  Laterality: N/A;  Laparoscopic cholecystectomy with attempted intraoperative cholangiogram  . Breast biopsy  03/19/2012    Procedure: BREAST BIOPSY WITH NEEDLE LOCALIZATION;  Surgeon: Gayland Curry, MD,FACS;  Location: [place];  Service: General;  Laterality: Right;  Right breast Needle localized biospy  Needle localization @ [facility]  7:30    History   Social History  .  Marital Status: Married    Spouse Name: N/A    Number of Children: 2  . Years of Education: N/A   Occupational History  . 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     Social History Main Topics  . Smoking status: Former Smoker -- 0.25 packs/day for 1 years    Quit date: 01/07/1980  . Smokeless tobacco: Not on file  . Alcohol Use: Yes     Comment: Rarely  . Drug Use: No  . Sexual Activity: Yes    Birth Control/ Protection: Surgical   Other Topics Concern  . Not on file   Social History Narrative  . No narrative on file    Family History  Problem Relation Age of Onset  . Cancer Maternal Aunt     breast and liver  . Heart disease Maternal Uncle   . Cancer Maternal  Grandmother     colon  . Hypertension Father   . Cancer Father     prostate  . Hypertension Son     ROS: mildly productive cough but no fevers or chills, hemoptysis, dysphasia, odynophagia, melena, hematochezia, dysuria, hematuria, rash, seizure activity, orthopnea, PND, claudication. Remaining systems are negative.  Physical Exam:   Blood pressure 100/78, pulse 93, height 5\' 5"  (1.651 m), weight 218 lb (98.884 kg), last menstrual period 09/06/1990.  General:  Well developed/obese in NAD Skin warm/dry Patient not depressed No peripheral clubbing Back-normal HEENT-normal/normal eyelids Neck supple/normal carotid upstroke bilaterally; no bruits; no JVD; no thyromegaly chest - CTA/ normal expansion CV - RRR/normal S1 and S2; no murmurs, rubs or gallops;  PMI nondisplaced Abdomen -NT/ND, no HSM, no mass, + bowel sounds, no bruit 2+ femoral pulses, no bruits Ext-no edema, chords, 2+ DP Neuro-grossly nonfocal  ECG NSR, no ST changes

## 2014-01-10 NOTE — Assessment & Plan Note (Signed)
Plan echocardiogram to assess LV function and exercise treadmill.

## 2014-01-10 NOTE — Assessment & Plan Note (Signed)
Etiology unclear. We discussed a CardioNet. Her symptoms are relatively infrequent. If she has worsening symptoms in the future we will consider the monitor at that time.

## 2014-01-10 NOTE — Assessment & Plan Note (Signed)
Patient describes some dizziness with standing. Her blood pressure is borderline. She does occasionally have edema and I will therefore continue HCTZ. I will however discontinue Cozaar and see if her blood pressure and symptoms improved. Monitor blood pressure at home.

## 2014-01-12 ENCOUNTER — Ambulatory Visit (INDEPENDENT_AMBULATORY_CARE_PROVIDER_SITE_OTHER): Payer: BC Managed Care – PPO | Admitting: Emergency Medicine

## 2014-01-12 ENCOUNTER — Telehealth: Payer: Self-pay | Admitting: Cardiology

## 2014-01-12 ENCOUNTER — Encounter: Payer: Self-pay | Admitting: Emergency Medicine

## 2014-01-12 VITALS — BP 140/94 | HR 108 | Ht 66.0 in | Wt 219.6 lb

## 2014-01-12 DIAGNOSIS — E039 Hypothyroidism, unspecified: Secondary | ICD-10-CM

## 2014-01-12 DIAGNOSIS — R059 Cough, unspecified: Secondary | ICD-10-CM

## 2014-01-12 DIAGNOSIS — R05 Cough: Secondary | ICD-10-CM

## 2014-01-12 MED ORDER — BENZONATATE 200 MG PO CAPS: 200.0000 mg | ORAL_CAPSULE | Freq: Three times a day (TID) | ORAL | Status: DC | PRN

## 2014-01-12 MED ORDER — FLUTICASONE PROPIONATE 50 MCG/ACT NA SUSP: 2.0000 | Freq: Every day | NASAL | Status: DC

## 2014-01-12 MED ORDER — HYDROCODONE-HOMATROPINE 5-1.5 MG/5ML PO SYRP: 5.0000 mL | ORAL_SOLUTION | Freq: Four times a day (QID) | ORAL | Status: DC | PRN

## 2014-01-12 NOTE — Progress Notes (Signed)
Subjective:    Patient ID: Shari Ruiz, female    DOB: August 31, 1961, 53 y.o.   MRN: [id number]  HPI 53 yo former smoker, hx HTN, allergic rhinitis, referred for ~ 3 months dyspnea, cough. well beyond her previous exertional baseline. She has congestion, has lost her voice. She was well before October, developed URI-type sx at that time. Had dark sinus drainage, some dark sputum. She is on loratadine and omeprazole x 2 yrs.  She was tried on Anoro, DuoNebs, without much response.   She has been rx abx and pred since November > was better temporarily    Review of Systems  Constitutional: Positive for unexpected weight change. Negative for fever.  HENT: Positive for ear pain, sneezing and trouble swallowing. Negative for congestion, dental problem, nosebleeds, postnasal drip, rhinorrhea, sinus pressure and sore throat.   Eyes: Negative for redness and itching.  Respiratory: Positive for cough and shortness of breath. Negative for chest tightness and wheezing.   Cardiovascular: Positive for chest pain and palpitations. Negative for leg swelling.  Gastrointestinal: Positive for abdominal distention. Negative for nausea and vomiting.  Genitourinary: Negative for dysuria.  Musculoskeletal: Negative for joint swelling.  Skin: Negative for rash.  Neurological: Positive for headaches.  Hematological: Does not bruise/bleed easily.  Psychiatric/Behavioral: Negative for dysphoric mood. The patient is not nervous/anxious.    Past Medical History  Diagnosis Date  . Hypertension   . Arthritis   . Breast fibroadenoma     right  . GERD (gastroesophageal reflux disease)   . Dysrhythmia     ??  PVC'S........ASYMPTOMATIC  . Pneumonia   . Depression     IN 2008, HUSBAND LOST HIS JOB...HAD TO MOVE OUT OF THE HOUSE     Family History  Problem Relation Age of Onset  . Cancer Maternal Aunt     breast and liver  . Heart disease Maternal Uncle   . Cancer Maternal Grandmother     colon  .  Hypertension Father   . Cancer Father     prostate  . Hypertension Son      History   Social History  . Marital Status: Married    Spouse Name: N/A    Number of Children: 2  . Years of Education: N/A   Occupational History  . 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     Social History Main Topics  . Smoking status: Former Smoker -- 0.25 packs/day for 1 years    Types: Cigarettes    Quit date: 01/07/1980  . Smokeless tobacco: Not on file  . Alcohol Use: Yes     Comment: Rarely  . Drug Use: No  . Sexual Activity: Yes    Birth Control/ Protection: Surgical   Other Topics Concern  . Not on file   Social History Narrative  . No narrative on file     Allergies  Allergen Reactions  . Lisinopril     cough     Outpatient Prescriptions Prior to Visit  Medication Sig Dispense Refill  . albuterol (PROVENTIL HFA;VENTOLIN HFA) 108 (90 BASE) MCG/ACT inhaler Inhale 2 puffs into the lungs every 6 (six) hours as needed for wheezing or shortness of breath.  1 Inhaler  2  . ALPRAZolam (XANAX) 0.5 MG tablet 1/2-1 tablet as needed three times a day  30 tablet  0  . hydrochlorothiazide (HYDRODIURIL) 25 MG tablet Take 25 mg by mouth daily.      Marland Kitchen ipratropium-albuterol (DUONEB) 0.5-2.5 (3) MG/3ML SOLN Take 3 mLs  by nebulization 3 (three) times daily as needed.  360 mL  5  . loratadine (CLARITIN) 10 MG tablet Take 10 mg by mouth daily.      . Magnesium 250 MG TABS Take 500 mg by mouth daily.      . Multiple Vitamins-Minerals (MULTIVITAMIN WITH MINERALS) tablet Take 1 tablet by mouth daily.      Marland Kitchen omeprazole (PRILOSEC) 20 MG capsule Take 20 mg by mouth daily.      . promethazine-codeine (PHENERGAN WITH CODEINE) 6.25-10 MG/5ML syrup Take 5 mLs by mouth every 6 (six) hours as needed for cough.  240 mL  0  . Umeclidinium-Vilanterol (ANORO ELLIPTA) 62.5-25 MCG/INH AEPB 1 inhalation two times daily as needed.  60 each  0   No facility-administered medications prior to visit.       Objective:    Physical Exam Filed Vitals:   01/12/14 1336  BP: 140/94  Pulse: 108  Height: 5\' 6"  (1.676 m)  Weight: 99.61 kg (219 lb 9.6 oz)  SpO2: 97%  Gen: Pleasant, overwt in no distress,  normal affect  ENT: No lesions,  mouth clear, hoarse voice, no postnasal drip  Neck: No JVD, no TMG, no carotid bruits, no stridor  Lungs: No use of accessory muscles,clear without rales or rhonchi  Cardiovascular: RRR, heart sounds normal, no murmur or gallops, no peripheral edema  Musculoskeletal: No deformities, no cyanosis or clubbing  Neuro: alert, non focal  Skin: Warm, no lesions or rashes       Assessment & Plan:  COUGH Suspect that this is upper airway disease. Will need to r/o occult asthma. Will attempt to focus on cyclical cough protocol, assess her breathing once the UA has calmed down.   We will perform full pulmonary function testing Please increase your omeprazole to 20mg  twice a day Continue your loratadine Please start fluticasone nasal spray, 2 sprays each nostril twice a day Work hard to stop throat clearing.  Plan a time when you will able to practice complete voice rest Use sugar free candy to ease cough and throat irritation.  We will use Hycodan and Tessalon Perles to suppress cough so your throat irritation can recover.  Follow with Dr Lamonte Sakai in 1 month with full PFT.

## 2014-01-12 NOTE — Telephone Encounter (Signed)
New message     Saw lung doctor today---bp [phone] today.  Lung doctor want her to let Dr Stanford Breed know about this and talk about CT  Primary doctor ordered on 01-07-14.

## 2014-01-12 NOTE — Telephone Encounter (Signed)
Purchase BP cuff and follow Kirk Ruths

## 2014-01-12 NOTE — Telephone Encounter (Signed)
Spoke with pt, dr byrum ask the pt to call us to find out if she needed to restart the bp med because her bp was 140/92 today in his office. The pt does not have a way of checking her pressure at home. Also she wants to make sure dr Stanford Breed sees the CT scan results from 01-07-14 and let her know if there is anything else she needs to do. Will forward for dr Stanford Breed review

## 2014-01-12 NOTE — Patient Instructions (Signed)
We will perform full pulmonary function testing Please increase your omeprazole to 20mg  twice a day Continue your loratadine Please start fluticasone nasal spray, 2 sprays each nostril twice a day Work hard to stop throat clearing.  Plan a time when you will able to practice complete voice rest Use sugar free candy to ease cough and throat irritation.  We will use Hycodan and Tessalon Perles to suppress cough so your throat irritation can recover.  Follow with Dr Lamonte Sakai in 1 month with full PFT.

## 2014-01-12 NOTE — Assessment & Plan Note (Signed)
Suspect that this is upper airway disease. Will need to r/o occult asthma. Will attempt to focus on cyclical cough protocol, assess her breathing once the UA has calmed down.   We will perform full pulmonary function testing Please increase your omeprazole to 20mg  twice a day Continue your loratadine Please start fluticasone nasal spray, 2 sprays each nostril twice a day Work hard to stop throat clearing.  Plan a time when you will able to practice complete voice rest Use sugar free candy to ease cough and throat irritation.  We will use Hycodan and Tessalon Perles to suppress cough so your throat irritation can recover.  Follow with Dr Lamonte Sakai in 1 month with full PFT.

## 2014-01-13 NOTE — Telephone Encounter (Signed)
Spoke with pt, Aware of dr crenshaw's recommendations.  °

## 2014-01-14 ENCOUNTER — Encounter: Payer: Self-pay | Admitting: Physician Assistant

## 2014-01-26 ENCOUNTER — Encounter: Payer: Self-pay | Admitting: Emergency Medicine

## 2014-02-01 ENCOUNTER — Ambulatory Visit (HOSPITAL_COMMUNITY): Payer: BC Managed Care – PPO | Attending: Cardiology | Admitting: Cardiology

## 2014-02-01 DIAGNOSIS — I10 Essential (primary) hypertension: Secondary | ICD-10-CM | POA: Insufficient documentation

## 2014-02-01 DIAGNOSIS — Z87891 Personal history of nicotine dependence: Secondary | ICD-10-CM | POA: Insufficient documentation

## 2014-02-01 DIAGNOSIS — R0600 Dyspnea, unspecified: Secondary | ICD-10-CM

## 2014-02-01 DIAGNOSIS — R079 Chest pain, unspecified: Secondary | ICD-10-CM

## 2014-02-01 DIAGNOSIS — R0989 Other specified symptoms and signs involving the circulatory and respiratory systems: Secondary | ICD-10-CM

## 2014-02-01 DIAGNOSIS — R0602 Shortness of breath: Secondary | ICD-10-CM | POA: Insufficient documentation

## 2014-02-01 DIAGNOSIS — R0609 Other forms of dyspnea: Secondary | ICD-10-CM

## 2014-02-01 NOTE — Progress Notes (Signed)
Echo performed. 

## 2014-02-14 ENCOUNTER — Ambulatory Visit (INDEPENDENT_AMBULATORY_CARE_PROVIDER_SITE_OTHER): Payer: BC Managed Care – PPO | Admitting: Physician Assistant

## 2014-02-14 DIAGNOSIS — R0989 Other specified symptoms and signs involving the circulatory and respiratory systems: Secondary | ICD-10-CM

## 2014-02-14 DIAGNOSIS — R079 Chest pain, unspecified: Secondary | ICD-10-CM

## 2014-02-14 DIAGNOSIS — R0609 Other forms of dyspnea: Secondary | ICD-10-CM

## 2014-02-14 DIAGNOSIS — R0600 Dyspnea, unspecified: Secondary | ICD-10-CM

## 2014-02-14 NOTE — Progress Notes (Signed)
Exercise Treadmill Test  Pre-Exercise Testing Evaluation Rhythm: normal sinus  Rate: 86 bpm     Test  Exercise Tolerance Test Ordering MD: Kirk Ruths, MD  Interpreting MD: Richardson Dopp, PA-C  Unique Test No: 1  Treadmill:  1  Indication for ETT: chest pain - rule out ischemia  Contraindication to ETT: No   Stress Modality: exercise - treadmill  Cardiac Imaging Performed: non   Protocol: standard Bruce - maximal  Max BP:  172/78  Max MPHR (bpm):  167 85% MPR (bpm):  142  MPHR obtained (bpm):  148 % MPHR obtained:  88  Reached 85% MPHR (min:sec):  7:38 Total Exercise Time (min-sec):  9:00  Workload in METS:  10.1 Borg Scale: 13  Reason ETT Terminated:  patient's desire to stop    ST Segment Analysis At Rest: normal ST segments - no evidence of significant ST depression With Exercise: non-specific ST changes  Other Information Arrhythmia:  No Angina during ETT:  absent (0) Quality of ETT:  diagnostic  ETT Interpretation:  normal - no evidence of ischemia by ST analysis  Comments: Good exercise capacity. No chest pain. Normal BP response to exercise. Non-specific ST changes. No significant changes to suggest ischemia.   Recommendations: F/u with Dr. Kirk Ruths as directed. Signed,  Richardson Dopp, PA-C   02/14/2014 11:18 AM

## 2014-02-16 ENCOUNTER — Encounter: Payer: Self-pay | Admitting: Emergency Medicine

## 2014-02-16 ENCOUNTER — Ambulatory Visit (INDEPENDENT_AMBULATORY_CARE_PROVIDER_SITE_OTHER): Payer: BC Managed Care – PPO | Admitting: Emergency Medicine

## 2014-02-16 VITALS — BP 138/86 | HR 111 | Ht 65.0 in | Wt 216.0 lb

## 2014-02-16 DIAGNOSIS — R059 Cough, unspecified: Secondary | ICD-10-CM

## 2014-02-16 DIAGNOSIS — R05 Cough: Secondary | ICD-10-CM

## 2014-02-16 NOTE — Assessment & Plan Note (Signed)
Your breathing tests are normal. They don't support asthma.  You can decrease omeprazole back to once a day Continue your loratadine daily You may stop albuterol If your cough returns consider increasing omeprazole back to twice a day During allergy season you should probably restart your fluticasone nose spray.  Follow with Dr Lamonte Sakai as needed

## 2014-02-16 NOTE — Progress Notes (Signed)
Subjective:    Patient ID: Shari Ruiz, female    DOB: 02-28-1961, 53 y.o.   MRN: [id number]  HPI 53 yo former smoker, hx HTN, allergic rhinitis, referred for ~ 3 months dyspnea, cough. well beyond her previous exertional baseline. She has congestion, has lost her voice. She was well before October, developed URI-type sx at that time. Had dark sinus drainage, some dark sputum. She is on loratadine and omeprazole x 2 yrs.  She was tried on Anoro, DuoNebs, without much response.   She has been rx abx and pred since November > was better temporarily  ROV 02/16/14 -- follow up for cough and SOB, allergies and rhinitis. She is much improved, has stopped nasal steroid, still on loratadine and omeprazole bid    Review of Systems  Constitutional: Positive for unexpected weight change. Negative for fever.  HENT: Positive for ear pain, sneezing and trouble swallowing. Negative for congestion, dental problem, nosebleeds, postnasal drip, rhinorrhea, sinus pressure and sore throat.   Eyes: Negative for redness and itching.  Respiratory: Positive for cough and shortness of breath. Negative for chest tightness and wheezing.   Cardiovascular: Positive for chest pain and palpitations. Negative for leg swelling.  Gastrointestinal: Positive for abdominal distention. Negative for nausea and vomiting.  Genitourinary: Negative for dysuria.  Musculoskeletal: Negative for joint swelling.  Skin: Negative for rash.  Neurological: Positive for headaches.  Hematological: Does not bruise/bleed easily.  Psychiatric/Behavioral: Negative for dysphoric mood. The patient is not nervous/anxious.    Past Medical History  Diagnosis Date  . Hypertension   . Arthritis   . Breast fibroadenoma     right  . GERD (gastroesophageal reflux disease)   . Dysrhythmia     ??  PVC'S........ASYMPTOMATIC  . Pneumonia   . Depression     IN 2008, HUSBAND LOST HIS JOB...HAD TO MOVE OUT OF THE HOUSE     Family History  Problem  Relation Age of Onset  . Cancer Maternal Aunt     breast and liver  . Heart disease Maternal Uncle   . Cancer Maternal Grandmother     colon  . Hypertension Father   . Cancer Father     prostate  . Hypertension Son      History   Social History  . Marital Status: Married    Spouse Name: N/A    Number of Children: 2  . Years of Education: N/A   Occupational History  . 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     Social History Main Topics  . Smoking status: Former Smoker -- 0.25 packs/day for 1 years    Types: Cigarettes    Quit date: 01/07/1980  . Smokeless tobacco: Not on file  . Alcohol Use: Yes     Comment: Rarely  . Drug Use: No  . Sexual Activity: Yes    Birth Control/ Protection: Surgical   Other Topics Concern  . Not on file   Social History Narrative  . No narrative on file     Allergies  Allergen Reactions  . Lisinopril     cough     Outpatient Prescriptions Prior to Visit  Medication Sig Dispense Refill  . hydrochlorothiazide (HYDRODIURIL) 25 MG tablet Take 25 mg by mouth daily.      Marland Kitchen HYDROcodone-homatropine (HYCODAN) 5-1.5 MG/5ML syrup Take 5 mLs by mouth every 6 (six) hours as needed for cough.  240 mL  0  . ipratropium-albuterol (DUONEB) 0.5-2.5 (3) MG/3ML SOLN Take 3 mLs by nebulization  3 (three) times daily as needed.  360 mL  5  . loratadine (CLARITIN) 10 MG tablet Take 10 mg by mouth daily.      . Magnesium 250 MG TABS Take 500 mg by mouth daily.      . Multiple Vitamins-Minerals (MULTIVITAMIN WITH MINERALS) tablet Take 1 tablet by mouth daily.      Marland Kitchen omeprazole (PRILOSEC) 20 MG capsule Take 20 mg by mouth daily. 2 tabs daily      . promethazine-codeine (PHENERGAN WITH CODEINE) 6.25-10 MG/5ML syrup Take 5 mLs by mouth every 6 (six) hours as needed for cough.  240 mL  0  . Umeclidinium-Vilanterol (ANORO ELLIPTA) 62.5-25 MCG/INH AEPB 1 inhalation two times daily as needed.  60 each  0  . albuterol (PROVENTIL HFA;VENTOLIN HFA) 108 (90 BASE) MCG/ACT  inhaler Inhale 2 puffs into the lungs every 6 (six) hours as needed for wheezing or shortness of breath.  1 Inhaler  2  . ALPRAZolam (XANAX) 0.5 MG tablet 1/2-1 tablet as needed three times a day  30 tablet  0  . benzonatate (TESSALON) 200 MG capsule Take 1 capsule (200 mg total) by mouth 3 (three) times daily as needed for cough.  30 capsule  3  . fluticasone (FLONASE) 50 MCG/ACT nasal spray Place 2 sprays into both nostrils daily.  16 g  2   No facility-administered medications prior to visit.       Objective:   Physical Exam Filed Vitals:   02/16/14 1559  BP: 138/86  Pulse: 111  Height: 5\' 5"  (1.651 m)  Weight: 216 lb (97.977 kg)  SpO2: 98%  Gen: Pleasant, overwt in no distress,  normal affect  ENT: No lesions,  mouth clear, voice stronger, no postnasal drip  Neck: No JVD, no TMG, no carotid bruits, no stridor  Lungs: No use of accessory muscles,clear without rales or rhonchi  Cardiovascular: RRR, heart sounds normal, no murmur or gallops, no peripheral edema  Musculoskeletal: No deformities, no cyanosis or clubbing  Neuro: alert, non focal  Skin: Warm, no lesions or rashes       Assessment & Plan:  COUGH Your breathing tests are normal. They don't support asthma.  You can decrease omeprazole back to once a day Continue your loratadine daily You may stop albuterol If your cough returns consider increasing omeprazole back to twice a day During allergy season you should probably restart your fluticasone nose spray.  Follow with Dr Lamonte Sakai as needed

## 2014-02-16 NOTE — Patient Instructions (Addendum)
Your breathing tests are normal. They don't support asthma.  You can decrease omeprazole back to once a day Continue your loratadine daily You may stop albuterol If your cough returns consider increasing omeprazole back to twice a day During allergy season you should probably restart your fluticasone nose spray.  Follow with Dr Kim Lauver as needed 

## 2014-02-16 NOTE — Progress Notes (Signed)
PFT done today. 

## 2014-03-08 ENCOUNTER — Ambulatory Visit: Payer: BC Managed Care – PPO | Admitting: Cardiology

## 2014-05-05 ENCOUNTER — Other Ambulatory Visit: Payer: Self-pay | Admitting: Emergency Medicine

## 2014-06-29 LAB — PULMONARY FUNCTION TEST
DL/VA % pred: 86 %
DL/VA: 4.24 ml/min/mmHg/L
DLCO UNC % PRED: 75 %
DLCO unc: 19.33 ml/min/mmHg
FEF 25-75 POST: 4.32 L/s
FEF 25-75 PRE: 4.17 L/s
FEF2575-%Change-Post: 3 %
FEF2575-%PRED-POST: 160 %
FEF2575-%PRED-PRE: 154 %
FEV1-%Change-Post: 1 %
FEV1-%Pred-Post: 105 %
FEV1-%Pred-Pre: 104 %
FEV1-PRE: 2.95 L
FEV1-Post: 2.99 L
FEV1FVC-%Change-Post: 1 %
FEV1FVC-%PRED-PRE: 107 %
FEV6-%CHANGE-POST: 1 %
FEV6-%PRED-POST: 98 %
FEV6-%PRED-PRE: 97 %
FEV6-POST: 3.44 L
FEV6-Pre: 3.4 L
FEV6FVC-%CHANGE-POST: 0 %
FEV6FVC-%Pred-Post: 103 %
FEV6FVC-%Pred-Pre: 102 %
FVC-%CHANGE-POST: 0 %
FVC-%PRED-POST: 95 %
FVC-%PRED-PRE: 95 %
FVC-POST: 3.44 L
FVC-PRE: 3.43 L
PRE FEV1/FVC RATIO: 86 %
Post FEV1/FVC ratio: 87 %
Post FEV6/FVC ratio: 100 %
Pre FEV6/FVC Ratio: 99 %
RV % pred: 76 %
RV: 1.45 L
TLC % pred: 94 %
TLC: 4.91 L

## 2014-07-06 ENCOUNTER — Encounter: Payer: Self-pay | Admitting: Physician Assistant

## 2014-07-06 ENCOUNTER — Ambulatory Visit (INDEPENDENT_AMBULATORY_CARE_PROVIDER_SITE_OTHER): Payer: 59 | Admitting: Physician Assistant

## 2014-07-06 VITALS — BP 122/72 | HR 72 | Temp 98.2°F | Resp 16 | Ht 65.0 in | Wt 216.0 lb

## 2014-07-06 DIAGNOSIS — L309 Dermatitis, unspecified: Secondary | ICD-10-CM

## 2014-07-06 DIAGNOSIS — M79642 Pain in left hand: Secondary | ICD-10-CM

## 2014-07-06 DIAGNOSIS — I10 Essential (primary) hypertension: Secondary | ICD-10-CM

## 2014-07-06 DIAGNOSIS — Z0000 Encounter for general adult medical examination without abnormal findings: Secondary | ICD-10-CM

## 2014-07-06 DIAGNOSIS — H9202 Otalgia, left ear: Secondary | ICD-10-CM

## 2014-07-06 DIAGNOSIS — R5381 Other malaise: Secondary | ICD-10-CM

## 2014-07-06 DIAGNOSIS — M79641 Pain in right hand: Secondary | ICD-10-CM

## 2014-07-06 DIAGNOSIS — R5383 Other fatigue: Secondary | ICD-10-CM

## 2014-07-06 LAB — CBC WITH DIFFERENTIAL/PLATELET
Basophils Absolute: 0 10*3/uL (ref 0.0–0.1)
Basophils Relative: 0 % (ref 0–1)
EOS ABS: 0.2 10*3/uL (ref 0.0–0.7)
EOS PCT: 2 % (ref 0–5)
HCT: 38.2 % (ref 36.0–46.0)
Hemoglobin: 13.7 g/dL (ref 12.0–15.0)
LYMPHS PCT: 33 % (ref 12–46)
Lymphs Abs: 3.1 10*3/uL (ref 0.7–4.0)
MCH: 30.4 pg (ref 26.0–34.0)
MCHC: 35.9 g/dL (ref 30.0–36.0)
MCV: 84.7 fL (ref 78.0–100.0)
Monocytes Absolute: 0.7 10*3/uL (ref 0.1–1.0)
Monocytes Relative: 7 % (ref 3–12)
Neutro Abs: 5.5 10*3/uL (ref 1.7–7.7)
Neutrophils Relative %: 58 % (ref 43–77)
PLATELETS: 334 10*3/uL (ref 150–400)
RBC: 4.51 MIL/uL (ref 3.87–5.11)
RDW: 13.7 % (ref 11.5–15.5)
WBC: 9.5 10*3/uL (ref 4.0–10.5)

## 2014-07-06 LAB — HEMOGLOBIN A1C
Hgb A1c MFr Bld: 5.7 % — ABNORMAL HIGH (ref <5.7)
Mean Plasma Glucose: 117 mg/dL — ABNORMAL HIGH (ref <117)

## 2014-07-06 MED ORDER — CLOTRIMAZOLE-BETAMETHASONE 1-0.05 % EX CREA: 1.0000 "application " | TOPICAL_CREAM | Freq: Two times a day (BID) | CUTANEOUS | Status: DC

## 2014-07-06 MED ORDER — LISDEXAMFETAMINE DIMESYLATE 40 MG PO CAPS
ORAL_CAPSULE | ORAL | Status: DC
Start: 2014-07-06 — End: 2014-07-06

## 2014-07-06 MED ORDER — LISDEXAMFETAMINE DIMESYLATE 20 MG PO CAPS: ORAL_CAPSULE | ORAL | Status: DC

## 2014-07-06 MED ORDER — SULFAMETHOXAZOLE-TMP DS 800-160 MG PO TABS: 1.0000 | ORAL_TABLET | Freq: Two times a day (BID) | ORAL | Status: DC

## 2014-07-06 NOTE — Progress Notes (Signed)
Complete Physical  Assessment and Plan: Hypertension  Arthritis  Breast fibroadenoma  GERD (gastroesophageal reflux disease)  Dysrhythmia  Depression  Staph infection from pedicure- Bactrim DS, clom/bet cream Left ear pain- abscess- will do bactrim DS Dry scaly nodule right forearm- will refer to Derm Bilateral hand pain- rule out autoimmune  Discussed med's effects and SE's. Screening labs and tests as requested with regular follow-up as recommended.  HPI 53 y.o. female  presents for a complete physical.  Her blood pressure has been controlled at home, today their BP is BP: 122/72 mmHg She does workout. She denies chest pain, shortness of breath, dizziness.  She is not on cholesterol medication and denies myalgias. Her cholesterol is at goal. The cholesterol last visit was:  LDL 89 Last A1C in the office was: 5.3 (5.8) Patient is on Vitamin D supplement, 57.    Recent pedicure and since pedicure having very hard nodules that are pruritic on bilateral feet/legs with some erythema.  Has area on right forearm that she would like to see a Dermatologist Left ear pain, painful to touch since yesterday with a headache. Denies teeth pain, abscess, sinus problems, ears popping/clicking.  Since off losartan no more SOB, palpitations, normal cardiology evaluation.  Complains of bilateral hand pain, stiffness, with occ cramping that makes it very difficult to put on necklace/button shirts  Very frustrated with weight loss, does admit to emotional eating and binge eating.  Wt Readings from Last 3 Encounters:  07/06/14 216 lb (97.977 kg)  02/16/14 216 lb (97.977 kg)  01/12/14 219 lb 9.6 oz (99.61 kg)   Current Medications:    Medication List       This list is accurate as of: 07/06/14  3:45 PM.  Always use your most recent med list.               hydrochlorothiazide 25 MG tablet  Commonly known as:  HYDRODIURIL  TAKE 1 TABLET BY MOUTH EVERY DAY **INS MAX IS 30 DAYS PER FILL      loratadine 10 MG tablet  Commonly known as:  CLARITIN  Take 10 mg by mouth daily.     Magnesium 250 MG Tabs  Take 500 mg by mouth daily.     multivitamin with minerals tablet  Take 1 tablet by mouth daily.     omeprazole 20 MG capsule  Commonly known as:  PRILOSEC  Take 20 mg by mouth daily. 2 tabs daily       Health Maintenance:   Immunization History  Administered Date(s) Administered  . Tdap 11/08/2012   Tetanus: 2013 Pneumovax: Flu vaccine: Zostavax: Pap: Dr. Dellis Filbert needs to go MGM: 2014 due again DEXA: Colonoscopy: 2012 Dr. Ardis Hughs EGD: Echo: 2015 55-60% Cardiolite 2015 negative Dr. Stanford Breed Sees Dr. Lamonte Sakai  Allergies:  Allergies  Allergen Reactions  . Lisinopril     cough   Medical History:  Past Medical History  Diagnosis Date  . Hypertension   . Arthritis   . Breast fibroadenoma     right  . GERD (gastroesophageal reflux disease)   . Dysrhythmia     ??  PVC'S........ASYMPTOMATIC  . Pneumonia   . Depression     IN 2008, HUSBAND LOST HIS JOB...HAD TO MOVE OUT OF THE HOUSE   Surgical History:  Past Surgical History  Procedure Laterality Date  . Breast lumpectomy      ON THE RIGHT  . Abdominal hysterectomy      FIBROIDS & ENDOMETRIOSIS  . Knee arthroscopy  RIGHT KNEE  . Cholecystectomy  01/09/2012    Procedure: LAPAROSCOPIC CHOLECYSTECTOMY;  Surgeon: Gayland Curry, MD;  Location: [facility];  Service: General;  Laterality: N/A;  Laparoscopic cholecystectomy with attempted intraoperative cholangiogram  . Breast biopsy  03/19/2012    Procedure: BREAST BIOPSY WITH NEEDLE LOCALIZATION;  Surgeon: Gayland Curry, MD,FACS;  Location: [facility];  Service: General;  Laterality: Right;  Right breast Needle localized biospy  Needle localization @ [facility]  7:30   Family History:  Family History  Problem Relation Age of Onset  . Cancer Maternal Aunt     breast and liver  . Heart disease Maternal Uncle   . Cancer Maternal Grandmother     colon   . Hypertension Father   . Cancer Father     prostate  . Hypertension Son    Social History:  History  Substance Use Topics  . Smoking status: Former Smoker -- 0.25 packs/day for 1 years    Types: Cigarettes    Quit date: 01/07/1980  . Smokeless tobacco: Not on file  . Alcohol Use: Yes     Comment: Rarely    Review of Systems: [X]  = complains of  [ ]  = denies  General: Fatigue [ ]  Fever [ ]  Chills [ ]  Weakness [ ]   Insomnia BUT MELATONIN HELPS [X ]Weight change [ ]  Night sweats [ ]   Change in appetite [ ]  Eyes: Redness [ ]  Blurred vision [ ]  Diplopia [ ]  Discharge [ ]   ENT: Congestion [ ]  Sinus Pain [ ]  Post Nasal Drip [ ]  Sore Throat [ ]  Earache [ X] hearing loss [ ]  Tinnitus [ ]  Snoring [ ]   Cardiac: Chest pain/pressure [ ]  SOB [ ]  Orthopnea [ ]   Palpitations [ ]   Paroxysmal nocturnal dyspnea[ ]  Claudication [ ]  Edema [ ]   Pulmonary: Cough [ ]  Wheezing[ ]   SOB [ ]   Pleurisy [ ]   GI: Nausea [ ]  Vomiting[ ]  Dysphagia[ ]  Heartburn[ ]  Abdominal pain [ ]  Constipation [ ] ; Diarrhea [ ]  BRBPR [ ]  Melena[ ]  Bloating [ ]  Hemorrhoids [ ]   GU: Hematuria[ ]  Dysuria [ ]  Nocturia[ ]  Urgency [ ]   Hesitancy [ ]  Discharge [ ]  Frequency [ ]   Breast:  Breast lumps [ ]   nipple discharge [ ]    Neuro: Headaches[ ]  Vertigo[ ]  Paresthesias[ ]  Spasm [ ]  Speech changes [ ]  Incoordination [ ]   Ortho: Arthritis [ ]  Joint pain [ ]  Muscle pain [ ]  Joint swelling [ ]  Back Pain [ ]  Skin:  Rash Valu.Nieves ]  Pruritis [ X] Change in skin lesion [ ]   Psych: Depression[ ]  Anxiety[ ]  Confusion [ ]  Memory loss [ ]   Heme/Lypmh: Bleeding [ ]  Bruising [ ]  Enlarged lymph nodes [ ]   Endocrine: Visual blurring [ ]  Paresthesia [ ]  Polyuria [ ]  Polydypsea [ ]    Heat/cold intolerance [ ]  Hypoglycemia [ ]   Physical Exam: Estimated body mass index is 35.94 kg/(m^2) as calculated from the following:   Height as of this encounter: 5\' 5"  (1.651 m).   Weight as of this encounter: 216 lb (97.977 kg). BP 122/72  Pulse 72  Temp(Src)  98.2 F (36.8 C)  Resp 16  Ht 5\' 5"  (1.651 m)  Wt 216 lb (97.977 kg)  BMI 35.94 kg/m2  LMP 09/06/1990 General Appearance: Well nourished, in no apparent distress. Eyes: PERRLA, EOMs, conjunctiva no swelling or erythema, normal fundi and vessels. Sinuses: No Frontal/maxillary tenderness ENT/Mouth: Ext aud  canals clear, left ear canal at 9 oclock tender abscess with post auricle lymphadenopathy , normal light reflex with TMs without erythema, bulging.  Good dentition. No erythema, swelling, or exudate on post pharynx. Tonsils not swollen or erythematous. Hearing normal.  Neck: Supple, thyroid normal. No bruits Respiratory: Respiratory effort normal, BS equal bilaterally without rales, rhonchi, wheezing or stridor. Cardio: RRR without murmurs, rubs or gallops. Brisk peripheral pulses without edema.  Chest: symmetric, with normal excursions and percussion. Breasts: defer Abdomen: Soft, +BS. Non tender, no guarding, rebound, hernias, masses, or organomegaly. .  Lymphatics: Non tender without lymphadenopathy.  Genitourinary: defer Musculoskeletal: Full ROM all peripheral extremities,5/5 strength, and normal gait. Skin: Warm, dry without rashes, lesions, ecchymosis. Nodules on bilateral feet Neuro: Cranial nerves intact, reflexes equal bilaterally. Normal muscle tone, no cerebellar symptoms. Sensation intact.  Psych: Awake and oriented X 3, normal affect, Insight and Judgment appropriate.   Vicie Mutters 3:34 PM

## 2014-07-06 NOTE — Patient Instructions (Signed)
Dr. Dellis Filbert,  Phone: [phone];      Preventative Care for Adults - Female      Shari Ruiz:  A routine yearly physical is a good way to check in with your primary care provider about your health and preventive screening. It is also an opportunity to share updates about your health and any concerns you have, and receive a thorough all-over exam.   Most health insurance companies pay for at least some preventative services.  Check with your health plan for specific coverages.  WHAT PREVENTATIVE SERVICES DO WOMEN NEED?  Adult women should have their weight and blood pressure checked regularly.   Women age 67 and older should have their cholesterol levels checked regularly.  Women should be screened for cervical cancer with a Pap smear and pelvic exam beginning at either age 60, or 3 years after they become sexually activity.    Breast cancer screening generally begins at age 11 with a mammogram and breast exam by your primary care provider.    Beginning at age 29 and continuing to age 43, women should be screened for colorectal cancer.  Certain people may need continued testing until age 65.  Updating vaccinations is part of preventative care.  Vaccinations help protect against diseases such as the flu.  Osteoporosis is a disease in which the bones lose minerals and strength as we age. Women ages 22 and over should discuss this with their caregivers, as should women after menopause who have other risk factors.  Lab tests are generally done as part of preventative care to screen for anemia and blood disorders, to screen for problems with the kidneys and liver, to screen for bladder problems, to check blood sugar, and to check your cholesterol level.  Preventative services generally include counseling about diet, exercise, avoiding tobacco, drugs, excessive alcohol consumption, and sexually transmitted infections.    GENERAL RECOMMENDATIONS FOR GOOD  HEALTH:  Healthy diet:  Eat a variety of foods, including fruit, vegetables, animal or vegetable protein, such as meat, fish, chicken, and eggs, or beans, lentils, tofu, and grains, such as rice.  Drink plenty of water daily.  Decrease saturated fat in the diet, avoid lots of red meat, processed foods, sweets, fast foods, and fried foods.  Exercise:  Aerobic exercise helps maintain good heart health. At least 30-40 minutes of moderate-intensity exercise is recommended. For example, a brisk walk that increases your heart rate and breathing. This should be done on most days of the week.   Find a type of exercise or a variety of exercises that you enjoy so that it becomes a part of your daily life.  Examples are running, walking, swimming, water aerobics, and biking.  For motivation and support, explore group exercise such as aerobic class, spin class, Zumba, Yoga,or  martial arts, etc.    Set exercise goals for yourself, such as a certain weight goal, walk or run in a race such as a 5k walk/run.  Speak to your primary care provider about exercise goals.  Disease prevention:  If you smoke or chew tobacco, find out from your caregiver how to quit. It can literally save your life, no matter how long you have been a tobacco user. If you do not use tobacco, never begin.   Maintain a healthy diet and normal weight. Increased weight leads to problems with blood pressure and diabetes.   The Body Mass Index or BMI is a way of measuring how much of your body is fat. Having  a BMI above 27 increases the risk of heart disease, diabetes, hypertension, stroke and other problems related to obesity. Your caregiver can help determine your BMI and based on it develop an exercise and dietary program to help you achieve or maintain this important measurement at a healthful level.  High blood pressure causes heart and blood vessel problems.  Persistent high blood pressure should be treated with medicine if weight  loss and exercise do not work.   Fat and cholesterol leaves deposits in your arteries that can block them. This causes heart disease and vessel disease elsewhere in your body.  If your cholesterol is found to be high, or if you have heart disease or certain other medical conditions, then you may need to have your cholesterol monitored frequently and be treated with medication.   Ask if you should have a cardiac stress test if your history suggests this. A stress test is a test done on a treadmill that looks for heart disease. This test can find disease prior to there being a problem.  Menopause can be associated with physical symptoms and risks. Hormone replacement therapy is available to decrease these. You should talk to your caregiver about whether starting or continuing to take hormones is right for you.   Osteoporosis is a disease in which the bones lose minerals and strength as we age. This can result in serious bone fractures. Risk of osteoporosis can be identified using a bone density scan. Women ages 72 and over should discuss this with their caregivers, as should women after menopause who have other risk factors. Ask your caregiver whether you should be taking a calcium supplement and Vitamin D, to reduce the rate of osteoporosis.   Avoid drinking alcohol in excess (more than two drinks per day).  Avoid use of street drugs. Do not share needles with anyone. Ask for professional help if you need assistance or instructions on stopping the use of alcohol, cigarettes, and/or drugs.  Brush your teeth twice a day with fluoride toothpaste, and floss once a day. Good oral hygiene prevents tooth decay and gum disease. The problems can be painful, unattractive, and can cause other health problems. Visit your dentist for a routine oral and dental check up and preventive care every 6-12 months.   Look at your skin regularly.  Use a mirror to look at your back. Notify your caregivers of changes in moles,  especially if there are changes in shapes, colors, a size larger than a pencil eraser, an irregular border, or development of new moles.  Safety:  Use seatbelts 100% of the time, whether driving or as a passenger.  Use safety devices such as hearing protection if you work in environments with loud noise or significant background noise.  Use safety glasses when doing any work that could send debris in to the eyes.  Use a helmet if you ride a bike or motorcycle.  Use appropriate safety gear for contact sports.  Talk to your caregiver about gun safety.  Use sunscreen with a SPF (or skin protection factor) of 15 or greater.  Lighter skinned people are at a greater risk of skin cancer. Don't forget to also wear sunglasses in order to protect your eyes from too much damaging sunlight. Damaging sunlight can accelerate cataract formation.   Practice safe sex. Use condoms. Condoms are used for birth control and to help reduce the spread of sexually transmitted infections (or STIs).  Some of the STIs are gonorrhea (the clap), chlamydia, syphilis, trichomonas,  herpes, HPV (human papilloma virus) and HIV (human immunodeficiency virus) which causes AIDS. The herpes, HIV and HPV are viral illnesses that have no cure. These can result in disability, cancer and death.   Keep carbon monoxide and smoke detectors in your home functioning at all times. Change the batteries every 6 months or use a model that plugs into the wall.   Vaccinations:  Stay up to date with your tetanus shots and other required immunizations. You should have a booster for tetanus every 10 years. Be sure to get your flu shot every year, since 5%-20% of the [place] population comes down with the flu. The flu vaccine changes each year, so being vaccinated once is not enough. Get your shot in the fall, before the flu season peaks.   Other vaccines to consider:  Human Papilloma Virus or HPV causes cancer of the cervix, and other infections that can be  transmitted from person to person. There is a vaccine for HPV, and females should get immunized between the ages of 85 and 9. It requires a series of 3 shots.   Pneumococcal vaccine to protect against certain types of pneumonia.  This is normally recommended for adults age 61 or older.  However, adults younger than 54 years old with certain underlying conditions such as diabetes, heart or lung disease should also receive the vaccine.  Shingles vaccine to protect against Varicella Zoster if you are older than age 39, or younger than 54 years old with certain underlying illness.  Hepatitis A vaccine to protect against a form of infection of the liver by a virus acquired from food.  Hepatitis B vaccine to protect against a form of infection of the liver by a virus acquired from blood or body fluids, particularly if you work in health care.  If you plan to travel internationally, check with your local health department for specific vaccination recommendations.  Cancer Screening:  Breast cancer screening is essential to preventive care for women. All women age 9 and older should perform a breast self-exam every month. At age 25 and older, women should have their caregiver complete a breast exam each year. Women at ages 45 and older should have a mammogram (x-ray film) of the breasts. Your caregiver can discuss how often you need mammograms.    Cervical cancer screening includes taking a Pap smear (sample of cells examined under a microscope) from the cervix (end of the uterus). It also includes testing for HPV (Human Papilloma Virus, which can cause cervical cancer). Screening and a pelvic exam should begin at age 92, or 3 years after a woman becomes sexually active. Screening should occur every year, with a Pap smear but no HPV testing, up to age 6. After age 39, you should have a Pap smear every 3 years with HPV testing, if no HPV was found previously.   Most routine colon cancer screening begins  at the age of 73. On a yearly basis, doctors may provide special easy to use take-home tests to check for hidden blood in the stool. Sigmoidoscopy or colonoscopy can detect the earliest forms of colon cancer and is life saving. These tests use a small camera at the end of a tube to directly examine the colon. Speak to your caregiver about this at age 16, when routine screening begins (and is repeated every 5 years unless early forms of pre-cancerous polyps or small growths are found).

## 2014-07-07 LAB — INSULIN, FASTING: Insulin fasting, serum: 16 u[IU]/mL (ref 3–28)

## 2014-07-07 LAB — IRON AND TIBC
%SAT: 16 % — AB (ref 20–55)
Iron: 53 ug/dL (ref 42–145)
TIBC: 330 ug/dL (ref 250–470)
UIBC: 277 ug/dL (ref 125–400)

## 2014-07-07 LAB — CK: Total CK: 67 U/L (ref 7–177)

## 2014-07-07 LAB — RHEUMATOID FACTOR: Rhuematoid fact SerPl-aCnc: <10 IU/mL (ref <=14)

## 2014-07-07 LAB — HEPATIC FUNCTION PANEL
ALBUMIN: 4.3 g/dL (ref 3.5–5.2)
ALT: 17 U/L (ref 0–35)
AST: 18 U/L (ref 0–37)
Alkaline Phosphatase: 87 U/L (ref 39–117)
Bilirubin, Direct: 0.1 mg/dL (ref 0.0–0.3)
Indirect Bilirubin: 0.4 mg/dL (ref 0.2–1.2)
Total Bilirubin: 0.5 mg/dL (ref 0.2–1.2)
Total Protein: 6.9 g/dL (ref 6.0–8.3)

## 2014-07-07 LAB — LIPID PANEL
CHOL/HDL RATIO: 4.4 ratio
CHOLESTEROL: 206 mg/dL — AB (ref 0–200)
HDL: 47 mg/dL (ref >39)
LDL Cholesterol: 126 mg/dL — ABNORMAL HIGH (ref 0–99)
TRIGLYCERIDES: 167 mg/dL — AB (ref <150)
VLDL: 33 mg/dL (ref 0–40)

## 2014-07-07 LAB — BASIC METABOLIC PANEL WITH GFR
BUN: 13 mg/dL (ref 6–23)
CALCIUM: 9.2 mg/dL (ref 8.4–10.5)
CO2: 25 mEq/L (ref 19–32)
CREATININE: 0.97 mg/dL (ref 0.50–1.10)
Chloride: 101 mEq/L (ref 96–112)
GFR, Est African American: 77 mL/min
GFR, Est Non African American: 67 mL/min
Glucose, Bld: 101 mg/dL — ABNORMAL HIGH (ref 70–99)
Potassium: 4.2 mEq/L (ref 3.5–5.3)
SODIUM: 139 meq/L (ref 135–145)

## 2014-07-07 LAB — VITAMIN B12: Vitamin B-12: 658 pg/mL (ref 211–911)

## 2014-07-07 LAB — URINALYSIS, ROUTINE W REFLEX MICROSCOPIC
Bilirubin Urine: NEGATIVE
Glucose, UA: NEGATIVE mg/dL
Hgb urine dipstick: NEGATIVE
Ketones, ur: NEGATIVE mg/dL
LEUKOCYTES UA: NEGATIVE
Nitrite: NEGATIVE
PROTEIN: NEGATIVE mg/dL
Specific Gravity, Urine: >1.03 — ABNORMAL HIGH (ref 1.005–1.030)
UROBILINOGEN UA: 0.2 mg/dL (ref 0.0–1.0)
pH: 6.5 (ref 5.0–8.0)

## 2014-07-07 LAB — VITAMIN D 25 HYDROXY (VIT D DEFICIENCY, FRACTURES): VIT D 25 HYDROXY: 68 ng/mL (ref 30–89)

## 2014-07-07 LAB — ANTI-DNA ANTIBODY, DOUBLE-STRANDED: DS DNA AB: 1 [IU]/mL

## 2014-07-07 LAB — MAGNESIUM: MAGNESIUM: 1.8 mg/dL (ref 1.5–2.5)

## 2014-07-07 LAB — FERRITIN: Ferritin: 171 ng/mL (ref 10–291)

## 2014-07-07 LAB — MICROALBUMIN / CREATININE URINE RATIO
Creatinine, Urine: 258 mg/dL
MICROALB UR: 0.81 mg/dL (ref 0.00–1.89)
Microalb Creat Ratio: 3.1 mg/g (ref 0.0–30.0)

## 2014-07-07 LAB — ANA: ANA: NEGATIVE

## 2014-07-07 LAB — TSH: TSH: 1.532 u[IU]/mL (ref 0.350–4.500)

## 2014-07-07 LAB — SEDIMENTATION RATE: Sed Rate: 10 mm/hr (ref 0–22)

## 2014-08-09 ENCOUNTER — Ambulatory Visit: Payer: Self-pay | Admitting: Physician Assistant

## 2014-09-19 ENCOUNTER — Other Ambulatory Visit: Payer: Self-pay

## 2014-09-19 MED ORDER — HYDROCHLOROTHIAZIDE 25 MG PO TABS: 25.0000 mg | ORAL_TABLET | Freq: Every day | ORAL | Status: AC

## 2014-10-30 ENCOUNTER — Encounter: Payer: Self-pay | Admitting: *Deleted

## 2015-01-26 ENCOUNTER — Encounter: Payer: Self-pay | Admitting: Emergency Medicine

## 2015-07-13 ENCOUNTER — Encounter: Payer: Self-pay | Admitting: Physician Assistant

## 2016-11-28 ENCOUNTER — Other Ambulatory Visit: Payer: Self-pay | Admitting: Gastroenterology

## 2017-01-03 DIAGNOSIS — R112 Nausea with vomiting, unspecified: Secondary | ICD-10-CM | POA: Diagnosis not present

## 2017-01-03 DIAGNOSIS — J209 Acute bronchitis, unspecified: Secondary | ICD-10-CM | POA: Diagnosis not present

## 2017-01-08 DIAGNOSIS — I10 Essential (primary) hypertension: Secondary | ICD-10-CM | POA: Diagnosis not present

## 2017-01-08 DIAGNOSIS — R5383 Other fatigue: Secondary | ICD-10-CM | POA: Diagnosis not present

## 2017-01-14 DIAGNOSIS — I10 Essential (primary) hypertension: Secondary | ICD-10-CM | POA: Diagnosis not present

## 2017-01-14 DIAGNOSIS — J301 Allergic rhinitis due to pollen: Secondary | ICD-10-CM | POA: Diagnosis not present

## 2017-01-22 ENCOUNTER — Encounter (HOSPITAL_COMMUNITY): Payer: Self-pay | Admitting: *Deleted

## 2017-01-24 ENCOUNTER — Encounter (HOSPITAL_COMMUNITY): Payer: Self-pay

## 2017-01-27 ENCOUNTER — Encounter (HOSPITAL_COMMUNITY)
Admission: RE | Disposition: A | Discharge status: Home / Self Care | Payer: Self-pay | Source: Ambulatory Visit | Attending: Gastroenterology

## 2017-01-27 ENCOUNTER — Ambulatory Visit (HOSPITAL_COMMUNITY)
Admission: RE | Admit: 2017-01-27 | Discharge: 2017-01-27 | Disposition: A | Discharge status: Home / Self Care | Payer: 59 | Source: Ambulatory Visit | Attending: Gastroenterology | Admitting: Gastroenterology

## 2017-01-27 ENCOUNTER — Encounter (HOSPITAL_COMMUNITY): Payer: Self-pay

## 2017-01-27 ENCOUNTER — Ambulatory Visit (HOSPITAL_COMMUNITY): Payer: 59 | Admitting: Anesthesiology

## 2017-01-27 DIAGNOSIS — Z90710 Acquired absence of both cervix and uterus: Secondary | ICD-10-CM | POA: Diagnosis not present

## 2017-01-27 DIAGNOSIS — Z87891 Personal history of nicotine dependence: Secondary | ICD-10-CM | POA: Diagnosis not present

## 2017-01-27 DIAGNOSIS — K219 Gastro-esophageal reflux disease without esophagitis: Secondary | ICD-10-CM | POA: Insufficient documentation

## 2017-01-27 DIAGNOSIS — D125 Benign neoplasm of sigmoid colon: Secondary | ICD-10-CM | POA: Insufficient documentation

## 2017-01-27 DIAGNOSIS — M1990 Unspecified osteoarthritis, unspecified site: Secondary | ICD-10-CM | POA: Insufficient documentation

## 2017-01-27 DIAGNOSIS — Z1211 Encounter for screening for malignant neoplasm of colon: Secondary | ICD-10-CM | POA: Insufficient documentation

## 2017-01-27 DIAGNOSIS — I10 Essential (primary) hypertension: Secondary | ICD-10-CM | POA: Diagnosis not present

## 2017-01-27 DIAGNOSIS — Z86010 Personal history of colonic polyps: Secondary | ICD-10-CM | POA: Diagnosis not present

## 2017-01-27 DIAGNOSIS — F329 Major depressive disorder, single episode, unspecified: Secondary | ICD-10-CM | POA: Diagnosis not present

## 2017-01-27 HISTORY — PX: COLONOSCOPY WITH PROPOFOL: SHX5780

## 2017-01-27 SURGERY — COLONOSCOPY WITH PROPOFOL
Anesthesia: Monitor Anesthesia Care

## 2017-01-27 MED ORDER — PROPOFOL 10 MG/ML IV BOLUS
INTRAVENOUS | Status: AC
  Filled 2017-01-27: qty 60

## 2017-01-27 MED ORDER — SODIUM CHLORIDE 0.9 % IV SOLN: INTRAVENOUS | Status: DC

## 2017-01-27 MED ORDER — PROPOFOL 10 MG/ML IV BOLUS
INTRAVENOUS | Status: DC | PRN
  Administered 2017-01-27: 20 mg via INTRAVENOUS
  Administered 2017-01-27: 30 mg via INTRAVENOUS
  Administered 2017-01-27: 20 mg via INTRAVENOUS

## 2017-01-27 MED ORDER — LACTATED RINGERS IV SOLN
INTRAVENOUS | Status: DC
Start: 2017-01-27 — End: 2017-01-27
  Administered 2017-01-27: 11:00:00 via INTRAVENOUS

## 2017-01-27 MED ORDER — PROPOFOL 500 MG/50ML IV EMUL
INTRAVENOUS | Status: DC | PRN
  Administered 2017-01-27: 125 ug/kg/min via INTRAVENOUS

## 2017-01-27 MED ORDER — LIDOCAINE 2% (20 MG/ML) 5 ML SYRINGE
INTRAMUSCULAR | Status: DC | PRN
  Administered 2017-01-27: 60 mg via INTRAVENOUS

## 2017-01-27 MED ORDER — LIDOCAINE 2% (20 MG/ML) 5 ML SYRINGE
INTRAMUSCULAR | Status: AC
  Filled 2017-01-27: qty 5

## 2017-01-27 SURGICAL SUPPLY — 21 items

## 2017-01-27 NOTE — Discharge Instructions (Signed)
Colonoscopy, Adult, Care After  This sheet gives you information about how to care for yourself after your procedure. Your health care provider may also give you more specific instructions. If you have problems or questions, contact your health care provider.  What can I expect after the procedure?  After the procedure, it is common to have:  · A small amount of blood in your stool for 24 hours after the procedure.  · Some gas.  · Mild abdominal cramping or bloating.    Follow these instructions at home:  General instructions     · For the first 24 hours after the procedure:  ? Do not drive or use machinery.  ? Do not sign important documents.  ? Do not drink alcohol.  ? Do your regular daily activities at a slower pace than normal.  ? Eat soft, easy-to-digest foods.  ? Rest often.  · Take over-the-counter or prescription medicines only as told by your health care provider.  · It is up to you to get the results of your procedure. Ask your health care provider, or the department performing the procedure, when your results will be ready.  Relieving cramping and bloating   · Try walking around when you have cramps or feel bloated.  · Apply heat to your abdomen as told by your health care provider. Use a heat source that your health care provider recommends, such as a moist heat pack or a heating pad.  ? Place a towel between your skin and the heat source.  ? Leave the heat on for 20-30 minutes.  ? Remove the heat if your skin turns bright red. This is especially important if you are unable to feel pain, heat, or cold. You may have a greater risk of getting burned.  Eating and drinking   · Drink enough fluid to keep your urine clear or pale yellow.  · Resume your normal diet as instructed by your health care provider. Avoid heavy or fried foods that are hard to digest.  · Avoid drinking alcohol for as long as instructed by your health care provider.  Contact a health care provider if:  · You have blood in your stool 2-3  days after the procedure.  Get help right away if:  · You have more than a small spotting of blood in your stool.  · You pass large blood clots in your stool.  · Your abdomen is swollen.  · You have nausea or vomiting.  · You have a fever.  · You have increasing abdominal pain that is not relieved with medicine.  This information is not intended to replace advice given to you by your health care provider. Make sure you discuss any questions you have with your health care provider.  Document Released: 07/09/2004 Document Revised: 08/19/2016 Document Reviewed: 02/06/2016  Elsevier Interactive Patient Education © 2017 [employer]

## 2017-01-27 NOTE — Anesthesia Procedure Notes (Signed)
Procedure Name: MAC Date/Time: 01/27/2017 11:28 AM Performed by: Dione Booze Pre-anesthesia Checklist: Patient identified, Emergency Drugs available, Suction available and Patient being monitored Patient Re-evaluated:Patient Re-evaluated prior to inductionOxygen Delivery Method: Simple face mask Placement Confirmation: positive ETCO2

## 2017-01-27 NOTE — Transfer of Care (Signed)
Immediate Anesthesia Transfer of Care Note  Patient: Shari Ruiz  Procedure(s) Performed: Procedure(s): COLONOSCOPY WITH PROPOFOL (N/A)  Patient Location: PACU  Anesthesia Type:MAC  Level of Consciousness: awake and patient cooperative  Airway & Oxygen Therapy: Patient Spontanous Breathing and Patient connected to face mask oxygen  Post-op Assessment: Report given to RN and Post -op Vital signs reviewed and stable  Post vital signs: Reviewed and stable  Last Vitals:  Vitals:   01/27/17 1038  BP: 128/84  Pulse: 73  Resp: 16  Temp: 36.6 C    Last Pain:  Vitals:   01/27/17 1038  TempSrc: Oral         Complications: No apparent anesthesia complications

## 2017-01-27 NOTE — H&P (Signed)
Procedure: Surveillance colonoscopy. 02/21/2011 colonoscopy was performed with removal of a diminutive sigmoid colon to go over adenomatous polyp  History: The patient is a 56 year old female born 09/03/1961. She is scheduled to undergo a surveillance colonoscopy today.  Past medical history: Hypertension. Right breast lumpectomy. Abdominal hysterectomy. Cholecystectomy. Right knee arthroscopy.  Medication allergies: Lisinopril causes cough  Exam: The patient is alert and lying comfortably on the endoscopy stretcher. Abdomen is soft and nontender to palpation. Lungs are clear to auscultation. Cardiac exam reveals a regular rhythm.  Plan: Proceed with surveillance colonoscopy

## 2017-01-27 NOTE — Anesthesia Preprocedure Evaluation (Addendum)
Anesthesia Evaluation  Patient identified by MRN, date of birth, ID band Patient awake    Reviewed: Allergy & Precautions, NPO status , Patient's Chart, lab work & pertinent test results  Airway Mallampati: II  TM Distance: >3 FB Neck ROM: Full    Dental  (+) Partial Upper   Pulmonary former smoker,    breath sounds clear to auscultation       Cardiovascular hypertension, + dysrhythmias  Rhythm:Regular Rate:Normal     Neuro/Psych PSYCHIATRIC DISORDERS Depression negative neurological ROS     GI/Hepatic Neg liver ROS, GERD  Medicated,  Endo/Other  negative endocrine ROS  Renal/GU negative Renal ROS  negative genitourinary   Musculoskeletal  (+) Arthritis , Osteoarthritis,    Abdominal   Peds negative pediatric ROS (+)  Hematology negative hematology ROS (+)   Anesthesia Other Findings   Reproductive/Obstetrics negative OB ROS                            Anesthesia Physical Anesthesia Plan  ASA: II  Anesthesia Plan: MAC   Post-op Pain Management:    Induction: Intravenous  Airway Management Planned: Natural Airway  Additional Equipment:   Intra-op Plan:   Post-operative Plan:   Informed Consent: I have reviewed the patients History and Physical, chart, labs and discussed the procedure including the risks, benefits and alternatives for the proposed anesthesia with the patient or authorized representative who has indicated his/her understanding and acceptance.     Plan Discussed with: CRNA  Anesthesia Plan Comments:         Anesthesia Quick Evaluation

## 2017-01-27 NOTE — Anesthesia Postprocedure Evaluation (Addendum)
Anesthesia Post Note  Patient: Shari Ruiz  Procedure(s) Performed: Procedure(s) (LRB): COLONOSCOPY WITH PROPOFOL (N/A)  Patient location during evaluation: PACU Anesthesia Type: MAC Level of consciousness: awake and alert Pain management: pain level controlled Vital Signs Assessment: post-procedure vital signs reviewed and stable Respiratory status: spontaneous breathing, nonlabored ventilation, respiratory function stable and patient connected to nasal cannula oxygen Cardiovascular status: stable and blood pressure returned to baseline Anesthetic complications: no       Last Vitals:  Vitals:   01/27/17 1038 01/27/17 1200  BP: 128/84 (!) 99/52  Pulse: 73 81  Resp: 16 20  Temp: 36.6 C 36.4 C    Last Pain:  Vitals:   01/27/17 1200  TempSrc: Oral                 Effie Berkshire

## 2017-01-27 NOTE — Op Note (Signed)
[facility] Patient Name: Shari Ruiz Procedure Date: 01/27/2017 MRN: [id number] Attending MD: Garlan Fair , MD Date of Birth: [birth year] CSN: [id number] Age: 56 Admit Type: Outpatient Procedure:                Colonoscopy Indications:              High risk colon cancer surveillance: 02/21/2011                            colonoscopy was performed with removal of a                            diminutive sigmoid colon tubular adenomatous polyp Providers:                Garlan Fair, MD, Jobe Igo, RN, Cherylynn Ridges, Technician, Dione Booze, CRNA Referring MD:              Medicines:                Propofol per Anesthesia Complications:            No immediate complications. Estimated Blood Loss:     Estimated blood loss: none. Procedure:                Pre-Anesthesia Assessment:                           - Prior to the procedure, a History and Physical                            was performed, and patient medications and                            allergies were reviewed. The patient's tolerance of                            previous anesthesia was also reviewed. The risks                            and benefits of the procedure and the sedation                            options and risks were discussed with the patient.                            All questions were answered, and informed consent                            was obtained. Prior Anticoagulants: The patient has                            taken no previous anticoagulant or antiplatelet  agents. ASA Grade Assessment: II - A patient with                            mild systemic disease. After reviewing the risks                            and benefits, the patient was deemed in                            satisfactory condition to undergo the procedure.                           After obtaining informed consent, the colonoscope                     was passed under direct vision. Throughout the                            procedure, the patient's blood pressure, pulse, and                            oxygen saturations were monitored continuously. The                            [id number] scope was introduced through                            the anus and advanced to the the cecum, identified                            by appendiceal orifice and ileocecal valve. The                            colonoscopy was performed without difficulty. The                            patient tolerated the procedure well. The quality                            of the bowel preparation was adequate. The terminal                            ileum, the ileocecal valve, the appendiceal orifice                            and the rectum were photographed. Findings:      The perianal and digital rectal examinations were normal.      The entire examined colon appeared normal. Impression:               - The entire examined colon is normal.                           - No specimens collected. Moderate Sedation:      N/A- Per Anesthesia Care Recommendation:           -  Patient has a contact number available for                            emergencies. The signs and symptoms of potential                            delayed complications were discussed with the                            patient. Return to normal activities tomorrow.                            Written discharge instructions were provided to the                            patient.                           - Repeat colonoscopy in 7 years for surveillance.                           - Resume previous diet.                           - Continue present medications. Procedure Code(s):        --- Professional ---                           [id number], Colorectal cancer screening; colonoscopy on                            individual at high risk Diagnosis Code(s):        ---  Professional ---                           Z86.010, Personal history of colonic polyps CPT copyright 2016 [employer]. All rights reserved. The codes documented in this report are preliminary and upon coder review may  be revised to meet current compliance requirements. Earle Gell, MD Garlan Fair, MD 01/27/2017 11:57:43 AM This report has been signed electronically. Number of Addenda: 0

## 2017-01-28 ENCOUNTER — Encounter (HOSPITAL_COMMUNITY): Payer: Self-pay | Admitting: Gastroenterology

## 2017-05-09 NOTE — Addendum Note (Signed)
Addendum  created 05/09/17 1027 by Effie Berkshire, MD   Sign clinical note

## 2017-05-21 DIAGNOSIS — Z1231 Encounter for screening mammogram for malignant neoplasm of breast: Secondary | ICD-10-CM | POA: Diagnosis not present

## 2017-11-04 DIAGNOSIS — J069 Acute upper respiratory infection, unspecified: Secondary | ICD-10-CM | POA: Diagnosis not present

## 2017-11-04 DIAGNOSIS — J019 Acute sinusitis, unspecified: Secondary | ICD-10-CM | POA: Diagnosis not present

## 2017-11-19 DIAGNOSIS — H5213 Myopia, bilateral: Secondary | ICD-10-CM | POA: Diagnosis not present

## 2017-11-19 DIAGNOSIS — H52223 Regular astigmatism, bilateral: Secondary | ICD-10-CM | POA: Diagnosis not present

## 2017-12-10 DIAGNOSIS — E782 Mixed hyperlipidemia: Secondary | ICD-10-CM | POA: Diagnosis not present

## 2017-12-10 DIAGNOSIS — I10 Essential (primary) hypertension: Secondary | ICD-10-CM | POA: Diagnosis not present

## 2017-12-10 DIAGNOSIS — Z79899 Other long term (current) drug therapy: Secondary | ICD-10-CM | POA: Diagnosis not present

## 2017-12-10 DIAGNOSIS — Z1159 Encounter for screening for other viral diseases: Secondary | ICD-10-CM | POA: Diagnosis not present

## 2017-12-10 DIAGNOSIS — R7302 Impaired glucose tolerance (oral): Secondary | ICD-10-CM | POA: Diagnosis not present

## 2018-01-28 DIAGNOSIS — D225 Melanocytic nevi of trunk: Secondary | ICD-10-CM | POA: Diagnosis not present

## 2018-01-28 DIAGNOSIS — L821 Other seborrheic keratosis: Secondary | ICD-10-CM | POA: Diagnosis not present

## 2018-01-28 DIAGNOSIS — D1801 Hemangioma of skin and subcutaneous tissue: Secondary | ICD-10-CM | POA: Diagnosis not present

## 2018-03-30 DIAGNOSIS — E782 Mixed hyperlipidemia: Secondary | ICD-10-CM | POA: Diagnosis not present

## 2018-03-30 DIAGNOSIS — I10 Essential (primary) hypertension: Secondary | ICD-10-CM | POA: Diagnosis not present

## 2018-03-30 DIAGNOSIS — Z79899 Other long term (current) drug therapy: Secondary | ICD-10-CM | POA: Diagnosis not present

## 2018-06-01 DIAGNOSIS — Z1231 Encounter for screening mammogram for malignant neoplasm of breast: Secondary | ICD-10-CM | POA: Diagnosis not present

## 2018-06-29 DIAGNOSIS — N3000 Acute cystitis without hematuria: Secondary | ICD-10-CM | POA: Diagnosis not present

## 2018-10-02 DIAGNOSIS — E782 Mixed hyperlipidemia: Secondary | ICD-10-CM | POA: Diagnosis not present

## 2018-10-02 DIAGNOSIS — Z79899 Other long term (current) drug therapy: Secondary | ICD-10-CM | POA: Diagnosis not present

## 2018-10-02 DIAGNOSIS — N183 Chronic kidney disease, stage 3 (moderate): Secondary | ICD-10-CM | POA: Diagnosis not present

## 2018-10-02 DIAGNOSIS — Z23 Encounter for immunization: Secondary | ICD-10-CM | POA: Diagnosis not present

## 2018-10-02 DIAGNOSIS — I10 Essential (primary) hypertension: Secondary | ICD-10-CM | POA: Diagnosis not present

## 2018-10-09 DIAGNOSIS — S93522A Sprain of metatarsophalangeal joint of left great toe, initial encounter: Secondary | ICD-10-CM | POA: Diagnosis not present

## 2019-03-03 DIAGNOSIS — J3089 Other allergic rhinitis: Secondary | ICD-10-CM | POA: Diagnosis not present

## 2019-03-03 DIAGNOSIS — R05 Cough: Secondary | ICD-10-CM | POA: Diagnosis not present

## 2019-03-03 DIAGNOSIS — Z77120 Contact with and (suspected) exposure to mold (toxic): Secondary | ICD-10-CM | POA: Diagnosis not present

## 2019-05-02 DIAGNOSIS — N390 Urinary tract infection, site not specified: Secondary | ICD-10-CM | POA: Diagnosis not present

## 2020-05-14 ENCOUNTER — Emergency Department
Admission: EM | Admit: 2020-05-14 | Discharge: 2020-05-14 | Disposition: A | Discharge status: Home / Self Care | Payer: 59 | Attending: Emergency Medicine | Admitting: Emergency Medicine

## 2020-05-14 ENCOUNTER — Other Ambulatory Visit: Payer: Self-pay

## 2020-05-14 DIAGNOSIS — I10 Essential (primary) hypertension: Secondary | ICD-10-CM | POA: Insufficient documentation

## 2020-05-14 DIAGNOSIS — S70362A Insect bite (nonvenomous), left thigh, initial encounter: Secondary | ICD-10-CM | POA: Diagnosis not present

## 2020-05-14 DIAGNOSIS — T7840XA Allergy, unspecified, initial encounter: Secondary | ICD-10-CM | POA: Diagnosis not present

## 2020-05-14 DIAGNOSIS — Y939 Activity, unspecified: Secondary | ICD-10-CM | POA: Diagnosis not present

## 2020-05-14 DIAGNOSIS — W57XXXA Bitten or stung by nonvenomous insect and other nonvenomous arthropods, initial encounter: Secondary | ICD-10-CM | POA: Insufficient documentation

## 2020-05-14 DIAGNOSIS — Y999 Unspecified external cause status: Secondary | ICD-10-CM | POA: Insufficient documentation

## 2020-05-14 DIAGNOSIS — F419 Anxiety disorder, unspecified: Secondary | ICD-10-CM | POA: Insufficient documentation

## 2020-05-14 DIAGNOSIS — Y929 Unspecified place or not applicable: Secondary | ICD-10-CM | POA: Diagnosis not present

## 2020-05-14 LAB — COMPREHENSIVE METABOLIC PANEL
ALT: 21 U/L (ref 0–44)
AST: 21 U/L (ref 15–41)
Albumin: 4.2 g/dL (ref 3.5–5.0)
Alkaline Phosphatase: 80 U/L (ref 38–126)
Anion gap: 10 (ref 5–15)
BUN: 22 mg/dL — ABNORMAL HIGH (ref 6–20)
CO2: 20 mmol/L — ABNORMAL LOW (ref 22–32)
Calcium: 8.8 mg/dL — ABNORMAL LOW (ref 8.9–10.3)
Chloride: 105 mmol/L (ref 98–111)
Creatinine, Ser: 1.33 mg/dL — ABNORMAL HIGH (ref 0.44–1.00)
GFR calc Af Amer: 51 mL/min — ABNORMAL LOW (ref >60)
GFR calc non Af Amer: 44 mL/min — ABNORMAL LOW (ref >60)
Glucose, Bld: 185 mg/dL — ABNORMAL HIGH (ref 70–99)
Potassium: 3.3 mmol/L — ABNORMAL LOW (ref 3.5–5.1)
Sodium: 135 mmol/L (ref 135–145)
Total Bilirubin: 0.8 mg/dL (ref 0.3–1.2)
Total Protein: 7.1 g/dL (ref 6.5–8.1)

## 2020-05-14 LAB — CBC WITH DIFFERENTIAL/PLATELET
Abs Immature Granulocytes: 0.02 10*3/uL (ref 0.00–0.07)
Basophils Absolute: 0 10*3/uL (ref 0.0–0.1)
Basophils Relative: 0 %
Eosinophils Absolute: 0.1 10*3/uL (ref 0.0–0.5)
Eosinophils Relative: 1 %
HCT: 40.1 % (ref 36.0–46.0)
Hemoglobin: 13.8 g/dL (ref 12.0–15.0)
Immature Granulocytes: 0 %
Lymphocytes Relative: 43 %
Lymphs Abs: 2.2 10*3/uL (ref 0.7–4.0)
MCH: 30.7 pg (ref 26.0–34.0)
MCHC: 34.4 g/dL (ref 30.0–36.0)
MCV: 89.3 fL (ref 80.0–100.0)
Monocytes Absolute: 0.3 10*3/uL (ref 0.1–1.0)
Monocytes Relative: 6 %
Neutro Abs: 2.5 10*3/uL (ref 1.7–7.7)
Neutrophils Relative %: 50 %
Platelets: 330 10*3/uL (ref 150–400)
RBC: 4.49 MIL/uL (ref 3.87–5.11)
RDW: 12.8 % (ref 11.5–15.5)
WBC: 5.1 10*3/uL (ref 4.0–10.5)
nRBC: 0 % (ref 0.0–0.2)

## 2020-05-14 MED ORDER — EPINEPHRINE 0.3 MG/0.3ML IJ SOAJ: 0.3000 mg | INTRAMUSCULAR | 0 refills | Status: DC | PRN

## 2020-05-14 MED ORDER — ONDANSETRON HCL 4 MG/2ML IJ SOLN
4.0000 mg | Freq: Once | INTRAMUSCULAR | Status: AC
  Administered 2020-05-14: 4 mg via INTRAVENOUS
  Filled 2020-05-14: qty 2

## 2020-05-14 MED ORDER — EPINEPHRINE 0.3 MG/0.3ML IJ SOAJ
0.3000 mg | Freq: Once | INTRAMUSCULAR | Status: AC
  Administered 2020-05-14: 0.3 mg via INTRAMUSCULAR

## 2020-05-14 MED ORDER — ALPRAZOLAM 0.5 MG PO TABS
0.5000 mg | ORAL_TABLET | Freq: Once | ORAL | Status: AC
  Administered 2020-05-14: 0.5 mg via ORAL
  Filled 2020-05-14: qty 1

## 2020-05-14 MED ORDER — METHYLPREDNISOLONE SODIUM SUCC 125 MG IJ SOLR
125.0000 mg | Freq: Once | INTRAMUSCULAR | Status: AC
  Administered 2020-05-14: 125 mg via INTRAVENOUS

## 2020-05-14 MED ORDER — FAMOTIDINE IN NACL 20-0.9 MG/50ML-% IV SOLN
20.0000 mg | Freq: Once | INTRAVENOUS | Status: AC
  Administered 2020-05-14: 20 mg via INTRAVENOUS

## 2020-05-14 NOTE — ED Notes (Signed)
Pt up to use bathroom 

## 2020-05-14 NOTE — ED Triage Notes (Signed)
Pt states about 30 minutes ago she began having an allergic reaction. Red rash noted to posterior L thigh where she states she was bit by unknown thing. Tongue swelling present. C/o chest tightness. Appears very anxious. A&O.

## 2020-05-14 NOTE — ED Notes (Signed)
Epi pen given to R thigh

## 2020-05-14 NOTE — ED Notes (Signed)
Pt given blanket.

## 2020-05-14 NOTE — ED Triage Notes (Signed)
First nurse note- pt walked in to ED c/o allergic reaction. Difficult to understand speech r/t tongue swelling. Pt having difficulty breathing. Called for room.   Husband reports pt was bit by something and started itching then began having tongue swelling. He gave two benadryl to her but continued to get worse.

## 2020-05-14 NOTE — ED Provider Notes (Signed)
[facility] Emergency Department Provider Note ____________________________________________   First MD Initiated Contact with Patient 05/14/20 1156     (approximate)  I have reviewed the triage vital signs and the nursing notes.   HISTORY  Chief Complaint Allergic Reaction    HPI Shari Ruiz is a 59 y.o. female with PMH as noted below who presents with an apparent allergic reaction, acute onset within the last 30 minutes, and starting after she was bitten or stung by something in the left leg.  She is not sure what it was, and did not see an insect or snake.  She felt an immediate sting there, and then shortly thereafter developed swelling to her tongue, tightness in her chest, and a general feeling of being unwell.  She reports some shortness of breath but denies cough or vomiting.  She has had no hives or itching.  She denies any known allergies, and has not had any prior reaction like this before.   Past Medical History:  Diagnosis Date  . Hypertension     There are no problems to display for this patient.   Past Surgical History:  Procedure Laterality Date  . ABDOMINAL HYSTERECTOMY    . CHOLECYSTECTOMY    . KNEE ARTHROSCOPY      Prior to Admission medications   Medication Sig Start Date End Date Taking? Authorizing Provider  EPINEPHrine (EPIPEN 2-PAK) 0.3 mg/0.3 mL IJ SOAJ injection Inject 0.3 mLs (0.3 mg total) into the muscle as needed for anaphylaxis. 05/14/20   Shari Silence, MD    Allergies Patient has no known allergies.  History reviewed. No pertinent family history.  Social History Social History   Tobacco Use  . Smoking status: Never Smoker  Substance Use Topics  . Alcohol use: Yes  . Drug use: Not on file    Review of Systems  Constitutional: No fever. Eyes: No redness. ENT: No throat tightness. Cardiovascular: Denies chest pain. Respiratory: Positive for shortness of breath. Gastrointestinal: No vomiting or  diarrhea.  Genitourinary: Negative for flank pain. Musculoskeletal: Negative for back pain. Skin: Negative for hives. Neurological: Negative for headache.   ____________________________________________   PHYSICAL EXAM:  VITAL SIGNS: ED Triage Vitals  Enc Vitals Group     BP 05/14/20 1201 (!) 174/96     Pulse Rate 05/14/20 1201 (!) 108     Resp 05/14/20 1201 20     Temp 05/14/20 1208 98.1 F (36.7 C)     Temp Source 05/14/20 1208 Oral     SpO2 05/14/20 1201 95 %     Weight --      Height --      Head Circumference --      Peak Flow --      Pain Score --      Pain Loc --      Pain Edu? --      Excl. in [place]? --     Constitutional: Alert and oriented.  Anxious and uncomfortable appearing but in no acute distress. Eyes: Conjunctivae are normal.  No injection. Head: Atraumatic. Nose: No congestion/rhinnorhea. Mouth/Throat: Mucous membranes are moist.  Tongue with mild swelling.  Posterior oropharynx clear.  No stridor or pooled secretions. Neck: Normal range of motion.  Cardiovascular: Tachycardic, regular rhythm. Grossly normal heart sounds.  Good peripheral circulation. Respiratory: Normal respiratory effort.  No retractions. Lungs CTAB. Gastrointestinal: No distention.  Musculoskeletal: No lower extremity edema.  Extremities warm and well perfused.  Approximately 5 cm urticarial welt on the left  lateral thigh.  Blanching and nontender. Neurologic:  Normal speech and language. No gross focal neurologic deficits are appreciated.  Skin:  Skin is warm and dry. No rash noted. Psychiatric: Anxious appearing.  ____________________________________________   LABS (all labs ordered are listed, but only abnormal results are displayed)  Labs Reviewed  COMPREHENSIVE METABOLIC PANEL - Abnormal; Notable for the following components:      Result Value   Potassium 3.3 (*)    CO2 20 (*)    Glucose, Bld 185 (*)    BUN 22 (*)    Creatinine, Ser 1.33 (*)    Calcium 8.8 (*)    GFR  calc non Af Amer 44 (*)    GFR calc Af Amer 51 (*)    All other components within normal limits  CBC WITH DIFFERENTIAL/PLATELET   ____________________________________________  EKG  ED ECG REPORT I, Shari Ruiz, the attending physician, personally viewed and interpreted this ECG.  Date: 05/14/2020 EKG Time: 1201 Rate: 110 Rhythm: Sinus tachycardia QRS Axis: normal Intervals: normal ST/T Wave abnormalities: normal Narrative Interpretation: no evidence of acute ischemia  ____________________________________________  RADIOLOGY    ____________________________________________   PROCEDURES  Procedure(s) performed: No  Procedures  Critical Care performed: Yes  CRITICAL CARE Performed by: Shari Ruiz   Total critical care time: 20 minutes  Critical care time was exclusive of separately billable procedures and treating other patients.  Critical care was necessary to treat or prevent imminent or life-threatening deterioration.  Critical care was time spent personally by me on the following activities: development of treatment plan with patient and/or surrogate as well as nursing, discussions with consultants, evaluation of patient's response to treatment, examination of patient, obtaining history from patient or surrogate, ordering and performing treatments and interventions, ordering and review of laboratory studies, ordering and review of radiographic studies, pulse oximetry and re-evaluation of patient's condition. ____________________________________________   INITIAL IMPRESSION / ASSESSMENT AND PLAN / ED COURSE  Pertinent labs & imaging results that were available during my care of the patient were reviewed by me and considered in my medical decision making (see chart for details).  59 year old female with a history of hypertension presents with an apparent allergic reaction, acute onset after she was bitten or stung by something in the left leg.  Her  primary symptoms are tongue swelling, chest tightness, and feeling lightheaded.  She denies any prior history of similar allergic reactions.  She took 50 mg of Benadryl prior to coming to the hospital.  On exam she is anxious but overall well-appearing.  The vital signs are normal except for hypertension and mild tachycardia.  The tongue is swollen, however the posterior oropharynx is clear, the patient has no stridor or pooled secretions, and her lungs are clear as well.  She has no urticaria except for the one welt on the left leg where the initial bite or sting was.  The patient was immediately placed on the monitor and given an EpiPen.  In addition we gave Solu-Medrol and Pepcid.  Within a few minutes the patient reported subjective improvement in her symptoms and appeared more comfortable.  Presentation is consistent with anaphylaxis related to an insect sting or bite.  We will monitor the patient for the next 4 to 6 hours.  ----------------------------------------- 12:28 PM on 05/14/2020 -----------------------------------------  Patient reports continued improvement in her symptoms.  She is still fairly anxious so I will give an alprazolam.  ----------------------------------------- 3:40 PM on 05/14/2020 -----------------------------------------  Patient continues to report improved symptoms.  I signed her out to the oncoming physician Dr. Joan Ruiz for reassessment after 4 PM and discharge if she continues to have no recurrent tongue swelling or other symptoms.  ____________________________________________   FINAL CLINICAL IMPRESSION(S) / ED DIAGNOSES  Final diagnoses:  Allergic reaction, initial encounter      NEW MEDICATIONS STARTED DURING THIS VISIT:  New Prescriptions   EPINEPHRINE (EPIPEN 2-PAK) 0.3 MG/0.3 ML IJ SOAJ INJECTION    Inject 0.3 mLs (0.3 mg total) into the muscle as needed for anaphylaxis.     Note:  This document was prepared using Dragon voice recognition  software and may include unintentional dictation errors.    Shari Silence, MD 05/14/20 1616

## 2020-05-14 NOTE — ED Provider Notes (Signed)
4:12 PM Reassessed patient.  She reports continued improvement in her symptoms.  The initial sting/allergic reaction site is improving in appearance.  She reports no recurrence in her symptoms.  She has no lip, tongue, uvular swelling.  No stridor, normal work of breathing, no hypoxia.  Feel she is stable for discharge at this time.  Rx for EpiPen provided by Dr. Cherylann Banas.  Advised follow-up with her primary care doctor and discussed strict return precautions.  Also discussed the strict need for presentation to the emergency department should she administer the EpiPen to herself in any future events.  Patient and husband at bedside voiced understanding and are comfortable with the plan and discharge.   Lilia Pro., MD 05/14/20 [id number]

## 2020-05-14 NOTE — Discharge Instructions (Addendum)
Use the EpiPen if you have a recurrent severe allergic reaction. You must present to the ER if you give yourself the EpiPen in any future reactions. Follow-up with your primary care doctor as needed, discuss possible referral to an allergy doctor. Return to the ER for any new or worsening symptoms.

## 2020-05-16 ENCOUNTER — Encounter (HOSPITAL_COMMUNITY): Payer: Self-pay | Admitting: Gastroenterology

## 2020-09-11 ENCOUNTER — Other Ambulatory Visit: Payer: Self-pay

## 2020-09-26 ENCOUNTER — Ambulatory Visit: Payer: Self-pay | Admitting: General Surgery

## 2020-09-26 DIAGNOSIS — N6021 Fibroadenosis of right breast: Secondary | ICD-10-CM

## 2020-10-27 ENCOUNTER — Encounter (HOSPITAL_BASED_OUTPATIENT_CLINIC_OR_DEPARTMENT_OTHER): Payer: Self-pay | Admitting: General Surgery

## 2020-10-27 ENCOUNTER — Other Ambulatory Visit: Payer: Self-pay

## 2020-11-01 ENCOUNTER — Encounter (HOSPITAL_BASED_OUTPATIENT_CLINIC_OR_DEPARTMENT_OTHER)
Admission: RE | Admit: 2020-11-01 | Discharge: 2020-11-01 | Disposition: A | Discharge status: Home / Self Care | Payer: 59 | Source: Ambulatory Visit | Attending: General Surgery | Admitting: General Surgery

## 2020-11-01 DIAGNOSIS — Z01818 Encounter for other preprocedural examination: Secondary | ICD-10-CM | POA: Insufficient documentation

## 2020-11-01 LAB — BASIC METABOLIC PANEL
Anion gap: 9 (ref 5–15)
BUN: 12 mg/dL (ref 6–20)
CO2: 25 mmol/L (ref 22–32)
Calcium: 9.4 mg/dL (ref 8.9–10.3)
Chloride: 102 mmol/L (ref 98–111)
Creatinine, Ser: 1.13 mg/dL — ABNORMAL HIGH (ref 0.44–1.00)
GFR, Estimated: 56 mL/min — ABNORMAL LOW (ref >60)
Glucose, Bld: 96 mg/dL (ref 70–99)
Potassium: 4.8 mmol/L (ref 3.5–5.1)
Sodium: 136 mmol/L (ref 135–145)

## 2020-11-01 NOTE — Progress Notes (Signed)

## 2020-11-04 ENCOUNTER — Other Ambulatory Visit (HOSPITAL_COMMUNITY)
Admission: RE | Admit: 2020-11-04 | Discharge: 2020-11-04 | Disposition: A | Discharge status: Home / Self Care | Payer: 59 | Source: Ambulatory Visit | Attending: General Surgery | Admitting: General Surgery

## 2020-11-04 DIAGNOSIS — Z01812 Encounter for preprocedural laboratory examination: Secondary | ICD-10-CM | POA: Diagnosis present

## 2020-11-04 DIAGNOSIS — Z20822 Contact with and (suspected) exposure to covid-19: Secondary | ICD-10-CM | POA: Diagnosis not present

## 2020-11-05 LAB — SARS CORONAVIRUS 2 (TAT 6-24 HRS): SARS Coronavirus 2: NEGATIVE

## 2020-11-08 ENCOUNTER — Encounter (HOSPITAL_BASED_OUTPATIENT_CLINIC_OR_DEPARTMENT_OTHER): Payer: Self-pay | Admitting: General Surgery

## 2020-11-08 ENCOUNTER — Other Ambulatory Visit: Payer: Self-pay

## 2020-11-08 ENCOUNTER — Ambulatory Visit (HOSPITAL_BASED_OUTPATIENT_CLINIC_OR_DEPARTMENT_OTHER)
Admission: RE | Admit: 2020-11-08 | Discharge: 2020-11-08 | Disposition: A | Discharge status: Home / Self Care | Payer: 59 | Attending: General Surgery | Admitting: General Surgery

## 2020-11-08 ENCOUNTER — Ambulatory Visit (HOSPITAL_BASED_OUTPATIENT_CLINIC_OR_DEPARTMENT_OTHER): Payer: 59 | Admitting: Certified Registered"

## 2020-11-08 ENCOUNTER — Encounter (HOSPITAL_BASED_OUTPATIENT_CLINIC_OR_DEPARTMENT_OTHER)
Admission: RE | Disposition: A | Discharge status: Home / Self Care | Payer: Self-pay | Source: Home / Self Care | Attending: General Surgery

## 2020-11-08 DIAGNOSIS — Z79899 Other long term (current) drug therapy: Secondary | ICD-10-CM | POA: Diagnosis not present

## 2020-11-08 DIAGNOSIS — N6489 Other specified disorders of breast: Secondary | ICD-10-CM | POA: Insufficient documentation

## 2020-11-08 DIAGNOSIS — Z888 Allergy status to other drugs, medicaments and biological substances status: Secondary | ICD-10-CM | POA: Diagnosis not present

## 2020-11-08 HISTORY — PX: BREAST LUMPECTOMY WITH RADIOACTIVE SEED LOCALIZATION: SHX6424

## 2020-11-08 SURGERY — BREAST LUMPECTOMY WITH RADIOACTIVE SEED LOCALIZATION
Anesthesia: General | Site: Breast | Laterality: Right

## 2020-11-08 MED ORDER — ACETAMINOPHEN 500 MG PO TABS
1000.0000 mg | ORAL_TABLET | ORAL | Status: AC
  Administered 2020-11-08: 1000 mg via ORAL

## 2020-11-08 MED ORDER — LIDOCAINE HCL (CARDIAC) PF 100 MG/5ML IV SOSY
PREFILLED_SYRINGE | INTRAVENOUS | Status: DC | PRN
  Administered 2020-11-08: 50 mg via INTRAVENOUS

## 2020-11-08 MED ORDER — ONDANSETRON HCL 4 MG/2ML IJ SOLN: 4.0000 mg | Freq: Four times a day (QID) | INTRAMUSCULAR | Status: DC | PRN

## 2020-11-08 MED ORDER — HYDROCODONE-ACETAMINOPHEN 5-325 MG PO TABS: 1.0000 | ORAL_TABLET | Freq: Four times a day (QID) | ORAL | 0 refills | Status: AC | PRN

## 2020-11-08 MED ORDER — CELECOXIB 200 MG PO CAPS
200.0000 mg | ORAL_CAPSULE | ORAL | Status: AC
  Administered 2020-11-08: 200 mg via ORAL

## 2020-11-08 MED ORDER — CELECOXIB 200 MG PO CAPS
ORAL_CAPSULE | ORAL | Status: AC
  Filled 2020-11-08: qty 1

## 2020-11-08 MED ORDER — DEXAMETHASONE SODIUM PHOSPHATE 10 MG/ML IJ SOLN
INTRAMUSCULAR | Status: AC
  Filled 2020-11-08: qty 1

## 2020-11-08 MED ORDER — BUPIVACAINE-EPINEPHRINE (PF) 0.25% -1:200000 IJ SOLN: INTRAMUSCULAR | Status: DC | PRN

## 2020-11-08 MED ORDER — CEFAZOLIN SODIUM-DEXTROSE 2-4 GM/100ML-% IV SOLN
2.0000 g | INTRAVENOUS | Status: AC
  Administered 2020-11-08: 2 g via INTRAVENOUS

## 2020-11-08 MED ORDER — GABAPENTIN 300 MG PO CAPS
ORAL_CAPSULE | ORAL | Status: AC
  Filled 2020-11-08: qty 1

## 2020-11-08 MED ORDER — OXYCODONE HCL 5 MG/5ML PO SOLN: 5.0000 mg | Freq: Once | ORAL | Status: AC | PRN

## 2020-11-08 MED ORDER — PROPOFOL 10 MG/ML IV BOLUS
INTRAVENOUS | Status: DC | PRN
  Administered 2020-11-08: 180 mg via INTRAVENOUS

## 2020-11-08 MED ORDER — FENTANYL CITRATE (PF) 100 MCG/2ML IJ SOLN
INTRAMUSCULAR | Status: DC | PRN
  Administered 2020-11-08 (×3): 25 ug via INTRAVENOUS

## 2020-11-08 MED ORDER — LIDOCAINE 2% (20 MG/ML) 5 ML SYRINGE
INTRAMUSCULAR | Status: AC
  Filled 2020-11-08: qty 5

## 2020-11-08 MED ORDER — LACTATED RINGERS IV SOLN: INTRAVENOUS | Status: DC

## 2020-11-08 MED ORDER — ACETAMINOPHEN 500 MG PO TABS
ORAL_TABLET | ORAL | Status: AC
  Filled 2020-11-08: qty 2

## 2020-11-08 MED ORDER — CHLORHEXIDINE GLUCONATE CLOTH 2 % EX PADS: 6.0000 | MEDICATED_PAD | Freq: Once | CUTANEOUS | Status: DC

## 2020-11-08 MED ORDER — GABAPENTIN 300 MG PO CAPS
300.0000 mg | ORAL_CAPSULE | ORAL | Status: AC
  Administered 2020-11-08: 300 mg via ORAL

## 2020-11-08 MED ORDER — MIDAZOLAM HCL 5 MG/5ML IJ SOLN
INTRAMUSCULAR | Status: DC | PRN
  Administered 2020-11-08: 2 mg via INTRAVENOUS

## 2020-11-08 MED ORDER — ONDANSETRON HCL 4 MG/2ML IJ SOLN
INTRAMUSCULAR | Status: DC | PRN
  Administered 2020-11-08: 4 mg via INTRAVENOUS

## 2020-11-08 MED ORDER — ONDANSETRON HCL 4 MG/2ML IJ SOLN
INTRAMUSCULAR | Status: AC
  Filled 2020-11-08: qty 2

## 2020-11-08 MED ORDER — CEFAZOLIN SODIUM-DEXTROSE 2-4 GM/100ML-% IV SOLN
INTRAVENOUS | Status: AC
  Filled 2020-11-08: qty 100

## 2020-11-08 MED ORDER — PROPOFOL 10 MG/ML IV BOLUS
INTRAVENOUS | Status: AC
  Filled 2020-11-08: qty 20

## 2020-11-08 MED ORDER — OXYCODONE HCL 5 MG PO TABS
5.0000 mg | ORAL_TABLET | Freq: Once | ORAL | Status: AC | PRN
  Administered 2020-11-08: 5 mg via ORAL

## 2020-11-08 MED ORDER — MIDAZOLAM HCL 2 MG/2ML IJ SOLN
INTRAMUSCULAR | Status: AC
  Filled 2020-11-08: qty 2

## 2020-11-08 MED ORDER — BUPIVACAINE HCL (PF) 0.25 % IJ SOLN
INTRAMUSCULAR | Status: DC | PRN
  Administered 2020-11-08: 20 mL

## 2020-11-08 MED ORDER — DEXAMETHASONE SODIUM PHOSPHATE 10 MG/ML IJ SOLN
INTRAMUSCULAR | Status: DC | PRN
  Administered 2020-11-08: 10 mg via INTRAVENOUS

## 2020-11-08 MED ORDER — FENTANYL CITRATE (PF) 100 MCG/2ML IJ SOLN
INTRAMUSCULAR | Status: AC
  Filled 2020-11-08: qty 2

## 2020-11-08 MED ORDER — OXYCODONE HCL 5 MG PO TABS
ORAL_TABLET | ORAL | Status: AC
  Filled 2020-11-08: qty 1

## 2020-11-08 MED ORDER — FENTANYL CITRATE (PF) 100 MCG/2ML IJ SOLN: 25.0000 ug | INTRAMUSCULAR | Status: DC | PRN

## 2020-11-08 SURGICAL SUPPLY — 43 items
ADH SKN CLS APL DERMABOND .7 (GAUZE/BANDAGES/DRESSINGS) ×1
APL PRP STRL LF DISP 70% ISPRP (MISCELLANEOUS) ×1
APPLIER CLIP 9.375 MED OPEN (MISCELLANEOUS)
APR CLP MED 9.3 20 MLT OPN (MISCELLANEOUS)
BLADE SURG 15 STRL LF DISP TIS (BLADE) ×1 IMPLANT
BLADE SURG 15 STRL SS (BLADE) ×3
CANISTER SUC SOCK COL 7IN (MISCELLANEOUS) IMPLANT
CANISTER SUCT 1200ML W/VALVE (MISCELLANEOUS) IMPLANT
CHLORAPREP W/TINT 26 (MISCELLANEOUS) ×3 IMPLANT
CLIP APPLIE 9.375 MED OPEN (MISCELLANEOUS) IMPLANT
COVER BACK TABLE 60X90IN (DRAPES) ×3 IMPLANT
COVER MAYO STAND STRL (DRAPES) ×3 IMPLANT
COVER PROBE W GEL 5X96 (DRAPES) ×3 IMPLANT
COVER WAND RF STERILE (DRAPES) IMPLANT
DECANTER SPIKE VIAL GLASS SM (MISCELLANEOUS) ×3 IMPLANT
DERMABOND ADVANCED (GAUZE/BANDAGES/DRESSINGS) ×2
DERMABOND ADVANCED .7 DNX12 (GAUZE/BANDAGES/DRESSINGS) ×1 IMPLANT
DRAPE LAPAROSCOPIC ABDOMINAL (DRAPES) ×3 IMPLANT
DRAPE UTILITY XL STRL (DRAPES) ×3 IMPLANT
ELECT COATED BLADE 2.86 ST (ELECTRODE) IMPLANT
ELECT REM PT RETURN 9FT ADLT (ELECTROSURGICAL) ×3
ELECTRODE REM PT RTRN 9FT ADLT (ELECTROSURGICAL) ×1 IMPLANT
GLOVE BIO SURGEON STRL SZ7.5 (GLOVE) ×3 IMPLANT
GOWN STRL REUS W/ TWL LRG LVL3 (GOWN DISPOSABLE) ×2 IMPLANT
GOWN STRL REUS W/TWL LRG LVL3 (GOWN DISPOSABLE) ×6
ILLUMINATOR WAVEGUIDE N/F (MISCELLANEOUS) IMPLANT
KIT MARKER MARGIN INK (KITS) ×3 IMPLANT
LIGHT WAVEGUIDE WIDE FLAT (MISCELLANEOUS) IMPLANT
NEEDLE HYPO 25X1 1.5 SAFETY (NEEDLE) ×3 IMPLANT
NS IRRIG 1000ML POUR BTL (IV SOLUTION) ×3 IMPLANT
PACK BASIN DAY SURGERY FS (CUSTOM PROCEDURE TRAY) ×3 IMPLANT
PENCIL SMOKE EVACUATOR (MISCELLANEOUS) ×3 IMPLANT
SLEEVE SCD COMPRESS KNEE MED (MISCELLANEOUS) ×3 IMPLANT
SPONGE LAP 18X18 RF (DISPOSABLE) ×3 IMPLANT
SUT MON AB 4-0 PC3 18 (SUTURE) ×3 IMPLANT
SUT SILK 2 0 SH (SUTURE) IMPLANT
SUT VICRYL 3-0 CR8 SH (SUTURE) ×3 IMPLANT
SYR CONTROL 10ML LL (SYRINGE) ×3 IMPLANT
TOWEL GREEN STERILE FF (TOWEL DISPOSABLE) ×3 IMPLANT
TRAY FAXITRON CT DISP (TRAY / TRAY PROCEDURE) ×3 IMPLANT
TUBE CONNECTING 20'X1/4 (TUBING)
TUBE CONNECTING 20X1/4 (TUBING) IMPLANT
YANKAUER SUCT BULB TIP NO VENT (SUCTIONS) IMPLANT

## 2020-11-08 NOTE — Anesthesia Preprocedure Evaluation (Signed)
Anesthesia Evaluation  Patient identified by MRN, date of birth, ID band Patient awake    Reviewed: Allergy & Precautions, H&P , NPO status , Patient's Chart, lab work & pertinent test results  Airway Mallampati: II   Neck ROM: full    Dental   Pulmonary neg pulmonary ROS,    breath sounds clear to auscultation       Cardiovascular hypertension,  Rhythm:regular Rate:Normal     Neuro/Psych PSYCHIATRIC DISORDERS Depression    GI/Hepatic GERD  ,  Endo/Other  obese  Renal/GU      Musculoskeletal  (+) Arthritis ,   Abdominal   Peds  Hematology   Anesthesia Other Findings   Reproductive/Obstetrics Breast mass                             Anesthesia Physical Anesthesia Plan  ASA: II  Anesthesia Plan: General   Post-op Pain Management:    Induction: Intravenous  PONV Risk Score and Plan: 3 and Ondansetron, Dexamethasone, Midazolam and Treatment may vary due to age or medical condition  Airway Management Planned: LMA  Additional Equipment:   Intra-op Plan:   Post-operative Plan: Extubation in OR  Informed Consent: I have reviewed the patients History and Physical, chart, labs and discussed the procedure including the risks, benefits and alternatives for the proposed anesthesia with the patient or authorized representative who has indicated his/her understanding and acceptance.       Plan Discussed with: CRNA, Anesthesiologist and Surgeon  Anesthesia Plan Comments:         Anesthesia Quick Evaluation

## 2020-11-08 NOTE — Interval H&P Note (Signed)
History and Physical Interval Note:  11/08/2020 10:50 AM  Shari Ruiz Wearing  has presented today for surgery, with the diagnosis of RIGHT BREAST COMPLEX SCLEROSING LESION.  The various methods of treatment have been discussed with the patient and family. After consideration of risks, benefits and other options for treatment, the patient has consented to  Procedure(s): RIGHT BREAST LUMPECTOMY WITH RADIOACTIVE SEED LOCALIZATION (Right) as a surgical intervention.  The patient's history has been reviewed, patient examined, no change in status, stable for surgery.  I have reviewed the patient's chart and labs.  Questions were answered to the patient's satisfaction.     [name]

## 2020-11-08 NOTE — Anesthesia Procedure Notes (Signed)
Procedure Name: LMA Insertion Date/Time: 11/08/2020 11:18 AM Performed by: Lavonia Dana, CRNA Pre-anesthesia Checklist: Patient identified, Emergency Drugs available, Suction available and Patient being monitored Patient Re-evaluated:Patient Re-evaluated prior to induction Oxygen Delivery Method: Circle system utilized Preoxygenation: Pre-oxygenation with 100% oxygen Induction Type: IV induction Ventilation: Mask ventilation without difficulty LMA: LMA inserted LMA Size: 4.0 Number of attempts: 1 Airway Equipment and Method: Bite block Placement Confirmation: positive ETCO2 Tube secured with: Tape Dental Injury: Teeth and Oropharynx as per pre-operative assessment

## 2020-11-08 NOTE — Discharge Instructions (Signed)
May take Tylenol and Ibuprofen after 3:50 pm, if needed.    Post Anesthesia Home Care Instructions  Activity: Get plenty of rest for the remainder of the day. A responsible individual must stay with you for 24 hours following the procedure.  For the next 24 hours, DO NOT: -Drive a car -Paediatric nurse -Drink alcoholic beverages -Take any medication unless instructed by your physician -Make any legal decisions or sign important papers.  Meals: Start with liquid foods such as gelatin or soup. Progress to regular foods as tolerated. Avoid greasy, spicy, heavy foods. If nausea and/or vomiting occur, drink only clear liquids until the nausea and/or vomiting subsides. Call your physician if vomiting continues.  Special Instructions/Symptoms: Your throat may feel dry or sore from the anesthesia or the breathing tube placed in your throat during surgery. If this causes discomfort, gargle with warm salt water. The discomfort should disappear within 24 hours.  If you had a scopolamine patch placed behind your ear for the management of post- operative nausea and/or vomiting:  1. The medication in the patch is effective for 72 hours, after which it should be removed.  Wrap patch in a tissue and discard in the trash. Wash hands thoroughly with soap and water. 2. You may remove the patch earlier than 72 hours if you experience unpleasant side effects which may include dry mouth, dizziness or visual disturbances. 3. Avoid touching the patch. Wash your hands with soap and water after contact with the patch.

## 2020-11-08 NOTE — Op Note (Signed)
11/08/2020  11:57 AM  PATIENT:  Shari Ruiz  59 y.o. female  PRE-OPERATIVE DIAGNOSIS:  RIGHT BREAST COMPLEX SCLEROSING LESION  POST-OPERATIVE DIAGNOSIS:  RIGHT BREAST COMPLEX SCLEROSING LESION  PROCEDURE:  Procedure(s): RIGHT BREAST LUMPECTOMY WITH RADIOACTIVE SEED LOCALIZATION (Right)  SURGEON:  Surgeon(s) and Role:    * Jovita Kussmaul, MD - Primary  PHYSICIAN ASSISTANT:   ASSISTANTS: none   ANESTHESIA:   local and general  EBL:  5 mL   BLOOD ADMINISTERED:none  DRAINS: none   LOCAL MEDICATIONS USED:  MARCAINE     SPECIMEN:  Source of Specimen:  right breast tissue  DISPOSITION OF SPECIMEN:  PATHOLOGY  COUNTS:  YES  TOURNIQUET:  * No tourniquets in log *  DICTATION: .Dragon Dictation   After informed consent was obtained the patient was brought to the operating room and placed in the supine position on the operating table.  After adequate induction of general anesthesia the patient's right breast was prepped with ChloraPrep, allowed to dry, and draped in usual sterile manner.  An appropriate timeout was performed.  Previously an I-125 seed was placed in the central portion of the right breast to mark an area of complex sclerosing lesion.  The neoprobe was set to I-125 in the area of radioactivity was readily identified.  The area around this was infiltrated with quarter percent Marcaine.  A curvilinear incision was made along the upper edge of the areola of the right breast with a 15 blade knife.  The incision was carried through the skin and subcutaneous tissue sharply with the electrocautery.  Dissection was then carried towards the radioactive seed under the direction of the neoprobe.  Once I more closely approached the radioactive seed I then removed a circular portion of breast tissue sharply with the electrocautery around the radioactive seed while checking the area of radioactivity frequently.  Once the specimen was removed it was oriented with the appropriate paint  colors.  A specimen radiograph was obtained that showed the clip and seed to be near the center of the specimen.  The specimen was then sent to pathology for further evaluation.  Hemostasis was achieved using the Bovie electrocautery.  The wound was infiltrated with more quarter percent Marcaine and irrigated with saline.  The deep layer of the wound was then closed with layers of interrupted 3-0 Vicryl stitches.  The skin was closed with interrupted 4-0 Monocryl subcuticular stitches.  Dermabond dressings were applied.  The patient tolerated the procedure well.  At the end of the case all needle sponge and instrument counts were correct.  The patient was then awakened and taken to recovery in stable condition.  PLAN OF CARE: Discharge to home after PACU  PATIENT DISPOSITION:  PACU - hemodynamically stable.   Delay start of Pharmacological VTE agent (>24hrs) due to surgical blood loss or risk of bleeding: not applicable

## 2020-11-08 NOTE — H&P (Signed)
[facility]  Location: [place] Office Patient #: [phone] DOB: 21-Dec-1960 Married / Language: English / Race: White Female   History of Present Illness  The patient is a 59 year old female who presents with a breast mass. we are asked to see the patient in consultation by Dr. Emmit Pomfret to evaluate her for a complex sclerosing lesion in the right breast. The patient is a 59 year old white female who recently went for routine screening mammogram. At that time she was found to have some abnormality in the right breast. The films are not available for [place] to review at this point. The abnormality was biopsied and came back as a complex sclerosing lesion with a papilloma. She does have a family history of breast cancer in an aunt. She also reports significant unrelenting pain radiating down her right arm. She does not smoke.   Past Surgical History  Breast Biopsy  Right. Colon Polyp Removal - Colonoscopy  Gallbladder Surgery - Laparoscopic  Hysterectomy (due to cancer) - Complete  Hysterectomy (not due to cancer) - Complete  Knee Surgery  Right. Oral Surgery   Diagnostic Studies History  Colonoscopy  5-10 years ago Mammogram  within last year Pap Smear  >5 years ago  Allergies Lisinopril *ANTIHYPERTENSIVES*  Cough.  Medication History Losartan Potassium (25MG  Tablet, Oral) Active. hydroCHLOROthiazide (25MG  Tablet, Oral) Active. buPROPion HCl ER (XL) (300MG  Tablet ER 24HR, Oral) Active. EpiPen Jr 2-Pak (0.15MG /0.3ML Soln Auto-inj, Injection) Active. Probiotic (Oral) Active. Medications Reconciled  Social History  Alcohol use  Occasional alcohol use. No drug use  Tobacco use  Never smoker.  Family History Alcohol Abuse  Brother, Father. Arthritis  Mother. Breast Cancer  Family Members In General. Cerebrovascular Accident  Mother. Colon Polyps  Family Members In General. Hypertension  Father. Prostate Cancer  Father. Thyroid  problems  Mother.  Pregnancy / Birth History Age at menarche  42 years. Age of menopause  <44 Gravida  2 Length (months) of breastfeeding  3-6 Maternal age  71-20 Para  2  Other Problems  Anxiety Disorder  Back Pain  Gastroesophageal Reflux Disease  Hemorrhoids  High blood pressure  Oophorectomy     Review of Systems  General Present- Fatigue. Not Present- Appetite Loss, Chills, Fever, Night Sweats, Weight Gain and Weight Loss. Skin Not Present- Change in Wart/Mole, Dryness, Hives, Jaundice, New Lesions, Non-Healing Wounds, Rash and Ulcer. HEENT Present- Wears glasses/contact lenses. Not Present- Earache, Hearing Loss, Hoarseness, Nose Bleed, Oral Ulcers, Ringing in the Ears, Seasonal Allergies, Sinus Pain, Sore Throat, Visual Disturbances and Yellow Eyes. Respiratory Not Present- Bloody sputum, Chronic Cough, Difficulty Breathing, Snoring and Wheezing. Breast Present- Breast Pain. Not Present- Breast Mass, Nipple Discharge and Skin Changes. Cardiovascular Not Present- Chest Pain, Difficulty Breathing Lying Down, Leg Cramps, Palpitations, Rapid Heart Rate, Shortness of Breath and Swelling of Extremities. Gastrointestinal Not Present- Abdominal Pain, Bloating, Bloody Stool, Change in Bowel Habits, Chronic diarrhea, Constipation, Difficulty Swallowing, Excessive gas, Gets full quickly at meals, Hemorrhoids, Indigestion, Nausea, Rectal Pain and Vomiting. Female Genitourinary Not Present- Frequency, Nocturia, Painful Urination, Pelvic Pain and Urgency. Musculoskeletal Present- Muscle Pain. Not Present- Back Pain, Joint Pain, Joint Stiffness, Muscle Weakness and Swelling of Extremities. Neurological Not Present- Decreased Memory, Fainting, Headaches, Numbness, Seizures, Tingling, Tremor, Trouble walking and Weakness. Psychiatric Present- Anxiety. Not Present- Bipolar, Change in Sleep Pattern, Depression, Fearful and Frequent crying. Endocrine Not Present- Cold Intolerance,  Excessive Hunger, Hair Changes, Heat Intolerance, Hot flashes and New Diabetes. Hematology Not Present- Blood Thinners, Easy Bruising,  Excessive bleeding, Gland problems, HIV and Persistent Infections.  Vitals  Weight: 209.5 lb Height: 66in Body Surface Area: 2.04 m Body Mass Index: 33.81 kg/m  Pulse: 93 (Regular)  P.OX: 97% (Room air)       Physical Exam  General Mental Status-Alert. General Appearance-Consistent with stated age. Hydration-Well hydrated. Voice-Normal.  Head and Neck Head-normocephalic, atraumatic with no lesions or palpable masses. Trachea-midline. Thyroid Gland Characteristics - normal size and consistency.  Eye Eyeball - Bilateral-Extraocular movements intact. Sclera/Conjunctiva - Bilateral-No scleral icterus.  Chest and Lung Exam Chest and lung exam reveals -quiet, even and easy respiratory effort with no use of accessory muscles and on auscultation, normal breath sounds, no adventitious sounds and normal vocal resonance. Inspection Chest Wall - Normal. Back - normal.  Breast Note: there is a palpable bruise in the inner aspect of the right breast. Other than this there is no palpable mass in either breast. There is no palpable axillary, supraclavicular, or cervical lymphadenopathy. She does have tenderness near the right inframammary fold but no palpable abnormality in this location.   Cardiovascular Cardiovascular examination reveals -normal heart sounds, regular rate and rhythm with no murmurs and normal pedal pulses bilaterally.  Abdomen Inspection Inspection of the abdomen reveals - No Hernias. Skin - Scar - no surgical scars. Palpation/Percussion Palpation and Percussion of the abdomen reveal - Soft, Non Tender, No Rebound tenderness, No Rigidity (guarding) and No hepatosplenomegaly. Auscultation Auscultation of the abdomen reveals - Bowel sounds normal.  Neurologic Neurologic evaluation reveals -alert and  oriented x 3 with no impairment of recent or remote memory. Mental Status-Normal.  Musculoskeletal Normal Exam - Left-Upper Extremity Strength Normal and Lower Extremity Strength Normal. Normal Exam - Right-Upper Extremity Strength Normal and Lower Extremity Strength Normal.  Lymphatic Head & Neck  General Head & Neck Lymphatics: Bilateral - Description - Normal. Axillary  General Axillary Region: Bilateral - Description - Normal. Tenderness - Non Tender. Femoral & Inguinal  Generalized Femoral & Inguinal Lymphatics: Bilateral - Description - Normal. Tenderness - Non Tender.    Assessment & Plan  SCLEROSING ADENOSIS OF BREAST, RIGHT (N60.21) Impression: the patient appears to have a small area of complex sclerosing lesion in the inner aspect of the right breast. Because of its abnormal appearance and because there could be a 5-10% chance of missing something more significant the recommendation is to have this area removed. I have discussed with her in detail the risks and benefits of the operation to remove this area as well as some of the technical aspects including the use of a radiofrequency seed and she understands and wishes to proceed. She is also having significant radiating right arm pain which sounds very much like nerve compression type of pain. Because of this I will evaluate her with an MRI of the C-spine and call her with the results. This patient encounter took 60 minutes today to perform the following: take history, perform exam, review outside records, interpret imaging, counsel the patient on their diagnosis and document encounter, findings & plan in the EHR Current Plans MRI (eg, proton), spinal canal and contents, cervical; w contrast material(s)(72142)(ACR 0 )(DSN [id number])(G-Code G1004(MG)) (Clinical Scenarios: No content available for this procedure)

## 2020-11-08 NOTE — Transfer of Care (Signed)
Immediate Anesthesia Transfer of Care Note  Patient: Shari Ruiz  Procedure(s) Performed: RIGHT BREAST LUMPECTOMY WITH RADIOACTIVE SEED LOCALIZATION (Right Breast)  Patient Location: PACU  Anesthesia Type:General  Level of Consciousness: awake, alert  and oriented  Airway & Oxygen Therapy: Patient Spontanous Breathing and Patient connected to face mask oxygen  Post-op Assessment: Report given to RN and Post -op Vital signs reviewed and stable  Post vital signs: Reviewed and stable  Last Vitals:  Vitals Value Taken Time  BP 135/79 11/08/20 1245  Temp 36.6 C 11/08/20 1245  Pulse 74 11/08/20 1245  Resp 20 11/08/20 1245  SpO2 100 % 11/08/20 1245    Last Pain:  Vitals:   11/08/20 1241  TempSrc:   PainSc: 5       Patients Stated Pain Goal: 1 (67/25/50 0164)  Complications: No complications documented.

## 2020-11-09 ENCOUNTER — Encounter (HOSPITAL_BASED_OUTPATIENT_CLINIC_OR_DEPARTMENT_OTHER): Payer: Self-pay | Admitting: General Surgery

## 2020-11-09 NOTE — Anesthesia Postprocedure Evaluation (Signed)
Anesthesia Post Note  Patient: Shari Ruiz  Procedure(s) Performed: RIGHT BREAST LUMPECTOMY WITH RADIOACTIVE SEED LOCALIZATION (Right Breast)     Patient location during evaluation: PACU Anesthesia Type: General Level of consciousness: awake and alert Pain management: pain level controlled Vital Signs Assessment: post-procedure vital signs reviewed and stable Respiratory status: spontaneous breathing, nonlabored ventilation, respiratory function stable and patient connected to nasal cannula oxygen Cardiovascular status: blood pressure returned to baseline and stable Postop Assessment: no apparent nausea or vomiting Anesthetic complications: no   No complications documented.  Last Vitals:  Vitals:   11/08/20 1227 11/08/20 1245  BP: 132/83 135/79  Pulse: 73 74  Resp: (!) 21 20  Temp:  36.6 C  SpO2: 100% 100%    Last Pain:  Vitals:   11/08/20 1241  TempSrc:   PainSc: [place]

## 2020-11-10 LAB — SURGICAL PATHOLOGY

## 2020-12-15 ENCOUNTER — Other Ambulatory Visit: Payer: Self-pay | Admitting: Student

## 2020-12-15 DIAGNOSIS — M5412 Radiculopathy, cervical region: Secondary | ICD-10-CM

## 2020-12-22 ENCOUNTER — Ambulatory Visit: Payer: 59

## 2021-01-03 ENCOUNTER — Ambulatory Visit: Payer: 59

## 2022-01-01 ENCOUNTER — Other Ambulatory Visit: Payer: Self-pay | Admitting: Gastroenterology

## 2022-01-01 DIAGNOSIS — R1013 Epigastric pain: Secondary | ICD-10-CM

## 2022-01-01 DIAGNOSIS — R194 Change in bowel habit: Secondary | ICD-10-CM

## 2022-01-01 DIAGNOSIS — R634 Abnormal weight loss: Secondary | ICD-10-CM

## 2022-01-01 DIAGNOSIS — R1084 Generalized abdominal pain: Secondary | ICD-10-CM

## 2022-01-16 ENCOUNTER — Ambulatory Visit
Admission: RE | Admit: 2022-01-16 | Discharge: 2022-01-16 | Disposition: A | Discharge status: Home / Self Care | Payer: 59 | Source: Ambulatory Visit | Attending: Gastroenterology | Admitting: Gastroenterology

## 2022-01-16 ENCOUNTER — Other Ambulatory Visit: Payer: Self-pay

## 2022-01-16 DIAGNOSIS — R1084 Generalized abdominal pain: Secondary | ICD-10-CM | POA: Insufficient documentation

## 2022-01-16 DIAGNOSIS — R194 Change in bowel habit: Secondary | ICD-10-CM | POA: Insufficient documentation

## 2022-01-16 DIAGNOSIS — R634 Abnormal weight loss: Secondary | ICD-10-CM | POA: Diagnosis present

## 2022-01-16 DIAGNOSIS — R1013 Epigastric pain: Secondary | ICD-10-CM | POA: Insufficient documentation

## 2022-01-16 LAB — POCT I-STAT CREATININE: Creatinine, Ser: 1.1 mg/dL — ABNORMAL HIGH (ref 0.44–1.00)

## 2022-01-16 MED ORDER — IOHEXOL 300 MG/ML  SOLN
100.0000 mL | Freq: Once | INTRAMUSCULAR | Status: AC | PRN
  Administered 2022-01-16: 100 mL via INTRAVENOUS

## 2022-01-22 ENCOUNTER — Encounter (HOSPITAL_COMMUNITY): Payer: Self-pay

## 2022-02-19 ENCOUNTER — Encounter: Payer: Self-pay | Admitting: General Surgery

## 2022-02-20 ENCOUNTER — Other Ambulatory Visit: Payer: Self-pay

## 2022-02-20 ENCOUNTER — Encounter
Admission: RE | Disposition: A | Discharge status: Home / Self Care | Payer: Self-pay | Source: Home / Self Care | Attending: General Surgery

## 2022-02-20 ENCOUNTER — Ambulatory Visit: Payer: 59 | Admitting: Certified Registered Nurse Anesthetist

## 2022-02-20 ENCOUNTER — Encounter: Payer: Self-pay | Admitting: General Surgery

## 2022-02-20 ENCOUNTER — Ambulatory Visit
Admission: RE | Admit: 2022-02-20 | Discharge: 2022-02-20 | Disposition: A | Discharge status: Home / Self Care | Payer: 59 | Attending: General Surgery | Admitting: General Surgery

## 2022-02-20 DIAGNOSIS — N189 Chronic kidney disease, unspecified: Secondary | ICD-10-CM | POA: Diagnosis not present

## 2022-02-20 DIAGNOSIS — R6881 Early satiety: Secondary | ICD-10-CM | POA: Diagnosis not present

## 2022-02-20 DIAGNOSIS — K319 Disease of stomach and duodenum, unspecified: Secondary | ICD-10-CM | POA: Insufficient documentation

## 2022-02-20 DIAGNOSIS — K2950 Unspecified chronic gastritis without bleeding: Secondary | ICD-10-CM | POA: Insufficient documentation

## 2022-02-20 DIAGNOSIS — F32A Depression, unspecified: Secondary | ICD-10-CM | POA: Insufficient documentation

## 2022-02-20 DIAGNOSIS — I129 Hypertensive chronic kidney disease with stage 1 through stage 4 chronic kidney disease, or unspecified chronic kidney disease: Secondary | ICD-10-CM | POA: Diagnosis not present

## 2022-02-20 DIAGNOSIS — R197 Diarrhea, unspecified: Secondary | ICD-10-CM | POA: Insufficient documentation

## 2022-02-20 DIAGNOSIS — K5730 Diverticulosis of large intestine without perforation or abscess without bleeding: Secondary | ICD-10-CM | POA: Diagnosis not present

## 2022-02-20 DIAGNOSIS — F419 Anxiety disorder, unspecified: Secondary | ICD-10-CM | POA: Insufficient documentation

## 2022-02-20 DIAGNOSIS — R1310 Dysphagia, unspecified: Secondary | ICD-10-CM | POA: Insufficient documentation

## 2022-02-20 HISTORY — DX: Radiculopathy, cervical region: M54.12

## 2022-02-20 HISTORY — DX: Other intervertebral disc displacement, thoracic region: M51.24

## 2022-02-20 HISTORY — DX: Chronic kidney disease, unspecified: N18.9

## 2022-02-20 HISTORY — DX: Obesity, unspecified: E66.9

## 2022-02-20 HISTORY — PX: COLONOSCOPY WITH PROPOFOL: SHX5780

## 2022-02-20 HISTORY — PX: ESOPHAGOGASTRODUODENOSCOPY (EGD) WITH PROPOFOL: SHX5813

## 2022-02-20 HISTORY — DX: Anxiety disorder, unspecified: F41.9

## 2022-02-20 SURGERY — COLONOSCOPY WITH PROPOFOL
Anesthesia: General

## 2022-02-20 MED ORDER — LIDOCAINE HCL (CARDIAC) PF 100 MG/5ML IV SOSY
PREFILLED_SYRINGE | INTRAVENOUS | Status: DC | PRN
  Administered 2022-02-20: 100 mg via INTRAVENOUS

## 2022-02-20 MED ORDER — PROPOFOL 10 MG/ML IV BOLUS
INTRAVENOUS | Status: DC | PRN
  Administered 2022-02-20: 70 mg via INTRAVENOUS
  Administered 2022-02-20: 30 mg via INTRAVENOUS

## 2022-02-20 MED ORDER — SODIUM CHLORIDE 0.9 % IV SOLN: INTRAVENOUS | Status: DC

## 2022-02-20 MED ORDER — PROPOFOL 500 MG/50ML IV EMUL
INTRAVENOUS | Status: DC | PRN
  Administered 2022-02-20: 160 ug/kg/min via INTRAVENOUS

## 2022-02-20 NOTE — Anesthesia Postprocedure Evaluation (Signed)
Anesthesia Post Note ? ?Patient: MACKENA PLUMMER ? ?Procedure(s) Performed: COLONOSCOPY WITH PROPOFOL ?ESOPHAGOGASTRODUODENOSCOPY (EGD) WITH PROPOFOL ? ?Patient location during evaluation: Endoscopy ?Anesthesia Type: General ?Level of consciousness: awake and alert ?Pain management: pain level controlled ?Vital Signs Assessment: post-procedure vital signs reviewed and stable ?Respiratory status: spontaneous breathing, nonlabored ventilation, respiratory function stable and patient connected to nasal cannula oxygen ?Cardiovascular status: blood pressure returned to baseline and stable ?Postop Assessment: no apparent nausea or vomiting ?Anesthetic complications: no ? ? ?No notable events documented. ? ? ?Last Vitals:  ?Vitals:  ? 02/20/22 1120 02/20/22 1130  ?BP: 134/84 130/82  ?Pulse: 83 72  ?Resp: 20 17  ?Temp:    ?SpO2: 99% 100%  ?  ?Last Pain:  ?Vitals:  ? 02/20/22 0945  ?PainSc: 0-No pain  ? ? ?  ?  ?  ?  ?  ?  ? ?Arita Miss ? ? ? ? ?

## 2022-02-20 NOTE — Op Note (Signed)
[facility] ?Gastroenterology ?Patient Name: Avalin Ruiz ?Procedure Date: 02/20/2022 10:18 AM ?MRN: [id number] ?Account #: [id number] ?Date of Birth: 02-08-1961 ?Admit Type: Outpatient ?Age: 61 ?Room: [facility] ENDO ROOM 3 ?Gender: Female ?Note Status: Finalized ?Instrument Name: Upper Endoscope [id number] ?Procedure:             Upper GI endoscopy ?Indications:           Dysphagia, Early satiety ?Providers:             Robert Bellow, MD ?Referring MD:          Forest Gleason Md, MD (Referring MD) ?Medicines:             Propofol per Anesthesia ?Complications:         No immediate complications. ?Procedure:             Pre-Anesthesia Assessment: ?                       - Prior to the procedure, a History and Physical was  ?                       performed, and patient medications, allergies and  ?                       sensitivities were reviewed. The patient's tolerance  ?                       of previous anesthesia was reviewed. ?                       - The risks and benefits of the procedure and the  ?                       sedation options and risks were discussed with the  ?                       patient. All questions were answered and informed  ?                       consent was obtained. ?                       After obtaining informed consent, the endoscope was  ?                       passed under direct vision. Throughout the procedure,  ?                       the patient's blood pressure, pulse, and oxygen  ?                       saturations were monitored continuously. The Endoscope  ?                       was introduced through the mouth, and advanced to the  ?                       fourth part of duodenum. The upper GI endoscopy was  ?  accomplished without difficulty. The patient tolerated  ?                       the procedure well. ?Findings: ?     The esophagus was normal. ?     Diffuse mild inflammation characterized by erythema was found in the  ?      prepyloric region of the stomach. Biopsies were taken with a cold  ?     forceps for histology. ?     The examined duodenum was normal. ?Impression:            - Normal esophagus. ?                       - Chronic gastritis. Biopsied. ?                       - Normal examined duodenum. ?Recommendation:        - Telephone endoscopist for pathology results in 1  ?                       week. ?Procedure Code(s):     --- Professional --- ?                       [phone], Esophagogastroduodenoscopy, flexible,  ?                       transoral; with biopsy, single or multiple ?Diagnosis Code(s):     --- Professional --- ?                       K29.50, Unspecified chronic gastritis without bleeding ?                       R13.10, Dysphagia, unspecified ?                       R68.81, Early satiety ?CPT copyright 2019 American Medical Association. All rights reserved. ?The codes documented in this report are preliminary and upon coder review may  ?be revised to meet current compliance requirements. ?Robert Bellow, MD ?02/20/2022 10:42:37 AM ?This report has been signed electronically. ?Number of Addenda: 0 ?Note Initiated On: 02/20/2022 10:18 AM ?Estimated Blood Loss:  Estimated blood loss was minimal. ?     [facility] ?

## 2022-02-20 NOTE — Anesthesia Preprocedure Evaluation (Signed)
Anesthesia Evaluation  ?Patient identified by MRN, date of birth, ID band ?Patient awake ? ? ? ?Reviewed: ?Allergy & Precautions, H&P , NPO status , Patient's Chart, lab work & pertinent test results ? ?History of Anesthesia Complications ?Negative for: history of anesthetic complications ? ?Airway ?Mallampati: II ? ?TM Distance: >3 FB ?Neck ROM: full ? ? ? Dental ? ?(+) Partial Upper ?  ?Pulmonary ?neg pulmonary ROS, neg sleep apnea, neg COPD, Patient abstained from smoking.Not current smoker,  ?  ?Pulmonary exam normal ?breath sounds clear to auscultation ? ? ? ? ? ? Cardiovascular ?Exercise Tolerance: Good ?METShypertension, (-) CAD and (-) Past MI (-) dysrhythmias  ?Rhythm:regular Rate:Normal ?- Systolic murmurs ? ?  ?Neuro/Psych ?PSYCHIATRIC DISORDERS Anxiety Depression negative neurological ROS ?   ? GI/Hepatic ?GERD  Medicated,(+)  ?  ? (-) substance abuse ? ,   ?Endo/Other  ?neg diabetesobese ? Renal/GU ?CRFRenal disease  ? ?  ?Musculoskeletal ? ?(+) Arthritis ,  ? Abdominal ?  ?Peds ? Hematology ?  ?Anesthesia Other Findings ?Past Medical History: ?No date: Anxiety ?No date: Arthritis ?No date: Breast fibroadenoma ?    Comment:  right ?No date: Cervical radiculopathy ?No date: Chronic kidney disease ?No date: Depression ?    Comment:  IN 2008, HUSBAND LOST HIS JOB...HAD TO MOVE OUT OF THE  ?             HOUSE ?No date: Dysrhythmia ?    Comment:  ??  PVC'S........ASYMPTOMATIC ?No date: GERD (gastroesophageal reflux disease) ?No date: Hypertension ?No date: Obesity ?No date: Pneumonia ?No date: Prolapsed thoracic intervertebral disc ? Reproductive/Obstetrics ?Breast mass ? ?  ? ? ? ? ? ? ? ? ? ? ? ? ? ?  ?  ? ? ? ? ? ? ? ? ?Anesthesia Physical ? ?Anesthesia Plan ? ?ASA: 2 ? ?Anesthesia Plan: General  ? ?Post-op Pain Management: Minimal or no pain anticipated  ? ?Induction: Intravenous ? ?PONV Risk Score and Plan: 3 and Ondansetron, Treatment may vary due to age or medical  condition, Propofol infusion and TIVA ? ?Airway Management Planned: Natural Airway ? ?Additional Equipment: None ? ?Intra-op Plan:  ? ?Post-operative Plan: Extubation in OR ? ?Informed Consent: I have reviewed the patients History and Physical, chart, labs and discussed the procedure including the risks, benefits and alternatives for the proposed anesthesia with the patient or authorized representative who has indicated his/her understanding and acceptance.  ? ? ? ?Dental advisory given ? ?Plan Discussed with: CRNA, Anesthesiologist and Surgeon ? ?Anesthesia Plan Comments: (Discussed risks of anesthesia with patient, including possibility of difficulty with spontaneous ventilation under anesthesia necessitating airway intervention, PONV, and rare risks such as cardiac or respiratory or neurological events, and allergic reactions. Discussed the role of CRNA in patient's perioperative care. Patient understands.)  ? ? ? ? ? ? ?Anesthesia Quick Evaluation ? ?

## 2022-02-20 NOTE — Op Note (Signed)
[facility] ?Gastroenterology ?Patient Name: Shari Ruiz ?Procedure Date: 02/20/2022 10:17 AM ?MRN: [id number] ?Account #: [id number] ?Date of Birth: 08-14-1961 ?Admit Type: Outpatient ?Age: 61 ?Room: [facility] ENDO ROOM 3 ?Gender: Female ?Note Status: Finalized ?Instrument Name: Peds Colonoscope [id number] ?Procedure:             Colonoscopy ?Indications:           Clinically significant diarrhea of unexplained origin ?Providers:             Robert Bellow, MD ?Referring MD:          Forest Gleason Md, MD (Referring MD) ?Medicines:             Propofol per Anesthesia ?Complications:         No immediate complications. ?Procedure:             Pre-Anesthesia Assessment: ?                       - Prior to the procedure, a History and Physical was  ?                       performed, and patient medications, allergies and  ?                       sensitivities were reviewed. The patient's tolerance  ?                       of previous anesthesia was reviewed. ?                       - The risks and benefits of the procedure and the  ?                       sedation options and risks were discussed with the  ?                       patient. All questions were answered and informed  ?                       consent was obtained. ?                       After obtaining informed consent, the colonoscope was  ?                       passed under direct vision. Throughout the procedure,  ?                       the patient's blood pressure, pulse, and oxygen  ?                       saturations were monitored continuously. The  ?                       Colonoscope was introduced through the anus and  ?                       advanced to the the terminal ileum. The colonoscopy  ?  was somewhat difficult due to restricted mobility of  ?                       the colon. The patient tolerated the procedure well.  ?                       The quality of the bowel preparation was excellent. ?                        Random biopsies of the TI, right and left colon were  ?                       completed. ?Findings: ?     A few small-mouthed diverticula were found in the sigmoid colon. ?     The entire examined colon appeared normal. ?Impression:            - Diverticulosis in the sigmoid colon. ?                       - The entire examined colon is normal. ?                       - No specimens collected. ?Recommendation:        - Await pathology results. ?                       - Telephone endoscopist for pathology results in 1  ?                       week. ?Procedure Code(s):     --- Professional --- ?                       [phone], Colonoscopy, flexible; diagnostic, including  ?                       collection of specimen(s) by brushing or washing, when  ?                       performed (separate procedure) ?Diagnosis Code(s):     --- Professional --- ?                       R19.7, Diarrhea, unspecified ?                       K57.30, Diverticulosis of large intestine without  ?                       perforation or abscess without bleeding ?CPT copyright 2019 American Medical Association. All rights reserved. ?The codes documented in this report are preliminary and upon coder review may  ?be revised to meet current compliance requirements. ?Robert Bellow, MD ?02/20/2022 11:14:13 AM ?This report has been signed electronically. ?Number of Addenda: 0 ?Note Initiated On: 02/20/2022 10:17 AM ?Scope Withdrawal Time: 0 hours 18 minutes 46 seconds  ?Total Procedure Duration: 0 hours 24 minutes 50 seconds  ?     [facility] ?

## 2022-02-20 NOTE — H&P (Signed)
Shari Ruiz ?[id number] ?[birth year] ? ?  ? ?HPI:  Early satiety and postprandial diarrhea. For endoscopy. ? ?Medications Prior to Admission  ?Medication Sig Dispense Refill Last Dose  ? Aspirin-Acetaminophen-Caffeine (EXCEDRIN EXTRA STRENGTH PO) Take by mouth.   Past Week  ? buPROPion (WELLBUTRIN XL) 300 MG 24 hr tablet Take 300 mg by mouth daily.   02/19/2022  ? DULoxetine (CYMBALTA) 60 MG capsule Take 60 mg by mouth daily.   02/19/2022  ? losartan (COZAAR) 25 MG tablet Take 25 mg by mouth daily.   02/20/2022 at 0600  ? EPINEPHrine (EPIPEN 2-PAK) 0.3 mg/0.3 mL IJ SOAJ injection Inject 0.3 mLs (0.3 mg total) into the muscle as needed for anaphylaxis. (Patient not taking: Reported on 02/20/2022) 1 each 0 Not Taking  ? hydrochlorothiazide (HYDRODIURIL) 25 MG tablet Take 1 tablet (25 mg total) by mouth daily. (Patient not taking: Reported on 02/20/2022) 90 tablet 3 Not Taking  ? HYDROcodone-acetaminophen (NORCO/VICODIN) 5-325 MG tablet Take 1-2 tablets by mouth every 6 (six) hours as needed for moderate pain or severe pain. (Patient not taking: Reported on 02/20/2022) 15 tablet 0 Not Taking  ? Melatonin 10 MG TABS Take 10 mg by mouth at bedtime as needed (sleep). (Patient not taking: Reported on 02/20/2022)   Not Taking  ? omeprazole (PRILOSEC) 20 MG capsule Take 40 mg by mouth daily as needed (heartburn).  (Patient not taking: Reported on 02/20/2022)   Not Taking  ? pantoprazole (PROTONIX) 40 MG tablet Take 40 mg by mouth daily. (Patient not taking: Reported on 02/20/2022)   Not Taking  ? ?Allergies  ?Allergen Reactions  ? Lisinopril   ?  cough  ? ?Past Medical History:  ?Diagnosis Date  ? Anxiety   ? Arthritis   ? Breast fibroadenoma   ? right  ? Cervical radiculopathy   ? Chronic kidney disease   ? Depression   ? IN 2008, HUSBAND LOST HIS JOB...HAD TO MOVE OUT OF THE HOUSE  ? Dysrhythmia   ? ??  PVC'S........ASYMPTOMATIC  ? GERD (gastroesophageal reflux disease)   ? Hypertension   ? Obesity   ? Pneumonia   ? Prolapsed  thoracic intervertebral disc   ? ?Past Surgical History:  ?Procedure Laterality Date  ? ABDOMINAL HYSTERECTOMY    ? FIBROIDS & ENDOMETRIOSIS  ? ABDOMINAL HYSTERECTOMY    ? BREAST BIOPSY  03/19/2012  ? Procedure: BREAST BIOPSY WITH NEEDLE LOCALIZATION;  Surgeon: Gayland Curry, MD,FACS;  Location: [place];  Service: General;  Laterality: Right;  Right breast Needle localized biospy  Needle localization @ [facility]  7:30  ? BREAST LUMPECTOMY    ? ON THE RIGHT  ? BREAST LUMPECTOMY WITH RADIOACTIVE SEED LOCALIZATION Right 11/08/2020  ? Procedure: RIGHT BREAST LUMPECTOMY WITH RADIOACTIVE SEED LOCALIZATION;  Surgeon: Jovita Kussmaul, MD;  Location: [place];  Service: General;  Laterality: Right;  ? CHOLECYSTECTOMY  01/09/2012  ? Procedure: LAPAROSCOPIC CHOLECYSTECTOMY;  Surgeon: Gayland Curry, MD;  Location: [facility] OR;  Service: General;  Laterality: N/A;  Laparoscopic cholecystectomy with attempted intraoperative cholangiogram  ? CHOLECYSTECTOMY    ? COLONOSCOPY WITH PROPOFOL N/A 01/27/2017  ? Procedure: COLONOSCOPY WITH PROPOFOL;  Surgeon: Garlan Fair, MD;  Location: [facility];  Service: Endoscopy;  Laterality: N/A;  ? KNEE ARTHROSCOPY    ? RIGHT KNEE  ? KNEE ARTHROSCOPY    ? ?Social History  ? ?Socioeconomic History  ? Marital status: Married  ?  Spouse name: Not on file  ? Number  of children: 2  ? Years of education: Not on file  ? Highest education level: Not on file  ?Occupational History  ? Occupation: Works in Leisure centre manager  ?  Employer: [employer]  ?Tobacco Use  ? Smoking status: Never  ? Smokeless tobacco: Never  ?Vaping Use  ? Vaping Use: Never used  ?Substance and Sexual Activity  ? Alcohol use: Yes  ?  Comment: Rarely  ? Drug use: No  ? Sexual activity: Yes  ?  Birth control/protection: Surgical  ?Other Topics Concern  ? Not on file  ?Social History Narrative  ? ** Merged History Encounter **  ?    ? ?Social Determinants of Health  ? ?Financial Resource Strain: Not on file   ?Food Insecurity: Not on file  ?Transportation Needs: Not on file  ?Physical Activity: Not on file  ?Stress: Not on file  ?Social Connections: Not on file  ?Intimate Partner Violence: Not on file  ? ?Social History  ? ?Social History Narrative  ? ** Merged History Encounter **  ?    ? ? ? ?ROS: Negative.  ? ? ? ?PE: ?HEENT: Negative. ?Lungs: Clear. ?Cardio: RR. ?ABD: Obese, soft, non-tender.  ? ? ?Assessment/Plan: ? ?Proceed with planned endoscopy.  ?Robert Bellow ?02/20/2022 ? ?  ?

## 2022-02-20 NOTE — Transfer of Care (Signed)
Immediate Anesthesia Transfer of Care Note ? ?Patient: Shari Ruiz ? ?Procedure(s) Performed: COLONOSCOPY WITH PROPOFOL ?ESOPHAGOGASTRODUODENOSCOPY (EGD) WITH PROPOFOL ? ?Patient Location: PACU ? ?Anesthesia Type:General ? ?Level of Consciousness: drowsy ? ?Airway & Oxygen Therapy: Patient Spontanous Breathing and Patient connected to nasal cannula oxygen ? ?Post-op Assessment: Report given to RN and Post -op Vital signs reviewed and stable ? ?Post vital signs: Reviewed and stable ? ?Last Vitals:  ?Vitals Value Taken Time  ?BP 134/84   ?Temp    ?Pulse 83 02/20/22 1113  ?Resp 21 02/20/22 1113  ?SpO2 97 % 02/20/22 1113  ?Vitals shown include unvalidated device data. ? ?Last Pain:  ?Vitals:  ? 02/20/22 0945  ?PainSc: 0-No pain  ?   ? ?  ? ?Complications: No notable events documented. ?

## 2022-02-20 NOTE — H&P (Signed)
Shari Ruiz ?[id number] ?12/12/1960 ? ?  ? ?HPI:  61 y/o woman with a one year history of post prandial bloating, gas and intermittent diarrhea.  Early satiety.  ?CT shows enlarging hepatic cysts, but < 8 cm in diameter.  Liquids better tolerated than solids. No clear food triggers bloating. Symptoms, if they occur are usually within 30 minutes of a meal.  ? ?Medications Prior to Admission  ?Medication Sig Dispense Refill Last Dose  ? Aspirin-Acetaminophen-Caffeine (EXCEDRIN EXTRA STRENGTH PO) Take by mouth.   Past Week  ? buPROPion (WELLBUTRIN XL) 300 MG 24 hr tablet Take 300 mg by mouth daily.   02/19/2022  ? DULoxetine (CYMBALTA) 60 MG capsule Take 60 mg by mouth daily.   02/19/2022  ? losartan (COZAAR) 25 MG tablet Take 25 mg by mouth daily.   02/20/2022 at 0600  ? EPINEPHrine (EPIPEN 2-PAK) 0.3 mg/0.3 mL IJ SOAJ injection Inject 0.3 mLs (0.3 mg total) into the muscle as needed for anaphylaxis. (Patient not taking: Reported on 02/20/2022) 1 each 0 Not Taking  ? hydrochlorothiazide (HYDRODIURIL) 25 MG tablet Take 1 tablet (25 mg total) by mouth daily. (Patient not taking: Reported on 02/20/2022) 90 tablet 3 Not Taking  ? HYDROcodone-acetaminophen (NORCO/VICODIN) 5-325 MG tablet Take 1-2 tablets by mouth every 6 (six) hours as needed for moderate pain or severe pain. (Patient not taking: Reported on 02/20/2022) 15 tablet 0 Not Taking  ? Melatonin 10 MG TABS Take 10 mg by mouth at bedtime as needed (sleep). (Patient not taking: Reported on 02/20/2022)   Not Taking  ? omeprazole (PRILOSEC) 20 MG capsule Take 40 mg by mouth daily as needed (heartburn).  (Patient not taking: Reported on 02/20/2022)   Not Taking  ? pantoprazole (PROTONIX) 40 MG tablet Take 40 mg by mouth daily. (Patient not taking: Reported on 02/20/2022)   Not Taking  ? ?Allergies  ?Allergen Reactions  ? Lisinopril   ?  cough  ? ?Past Medical History:  ?Diagnosis Date  ? Anxiety   ? Arthritis   ? Breast fibroadenoma   ? right  ? Cervical radiculopathy   ?  Chronic kidney disease   ? Depression   ? IN 2008, HUSBAND LOST HIS JOB...HAD TO MOVE OUT OF THE HOUSE  ? Dysrhythmia   ? ??  PVC'S........ASYMPTOMATIC  ? GERD (gastroesophageal reflux disease)   ? Hypertension   ? Obesity   ? Pneumonia   ? Prolapsed thoracic intervertebral disc   ? ?Past Surgical History:  ?Procedure Laterality Date  ? ABDOMINAL HYSTERECTOMY    ? FIBROIDS & ENDOMETRIOSIS  ? ABDOMINAL HYSTERECTOMY    ? BREAST BIOPSY  03/19/2012  ? Procedure: BREAST BIOPSY WITH NEEDLE LOCALIZATION;  Surgeon: Gayland Curry, MD,FACS;  Location: [place];  Service: General;  Laterality: Right;  Right breast Needle localized biospy  Needle localization @ [facility]  7:30  ? BREAST LUMPECTOMY    ? ON THE RIGHT  ? BREAST LUMPECTOMY WITH RADIOACTIVE SEED LOCALIZATION Right 11/08/2020  ? Procedure: RIGHT BREAST LUMPECTOMY WITH RADIOACTIVE SEED LOCALIZATION;  Surgeon: Jovita Kussmaul, MD;  Location: [place];  Service: General;  Laterality: Right;  ? CHOLECYSTECTOMY  01/09/2012  ? Procedure: LAPAROSCOPIC CHOLECYSTECTOMY;  Surgeon: Gayland Curry, MD;  Location: [facility] OR;  Service: General;  Laterality: N/A;  Laparoscopic cholecystectomy with attempted intraoperative cholangiogram  ? CHOLECYSTECTOMY    ? COLONOSCOPY WITH PROPOFOL N/A 01/27/2017  ? Procedure: COLONOSCOPY WITH PROPOFOL;  Surgeon: Garlan Fair, MD;  Location:  [facility];  Service: Endoscopy;  Laterality: N/A;  ? KNEE ARTHROSCOPY    ? RIGHT KNEE  ? KNEE ARTHROSCOPY    ? ?Social History  ? ?Socioeconomic History  ? Marital status: Married  ?  Spouse name: Not on file  ? Number of children: 2  ? Years of education: Not on file  ? Highest education level: Not on file  ?Occupational History  ? Occupation: Works in Leisure centre manager  ?  Employer: [employer]  ?Tobacco Use  ? Smoking status: Never  ? Smokeless tobacco: Never  ?Vaping Use  ? Vaping Use: Never used  ?Substance and Sexual Activity  ? Alcohol use: Yes  ?  Comment: Rarely  ? Drug  use: No  ? Sexual activity: Yes  ?  Birth control/protection: Surgical  ?Other Topics Concern  ? Not on file  ?Social History Narrative  ? ** Merged History Encounter **  ?    ? ?Social Determinants of Health  ? ?Financial Resource Strain: Not on file  ?Food Insecurity: Not on file  ?Transportation Needs: Not on file  ?Physical Activity: Not on file  ?Stress: Not on file  ?Social Connections: Not on file  ?Intimate Partner Violence: Not on file  ? ?Social History  ? ?Social History Narrative  ? ** Merged History Encounter **  ?    ? ? ? ?ROS: Negative.  ? ? ? ?PE: ?HEENT: Negative. ?Lungs: Clear. ?Cardio: RR. ?ABD: Soft, non-tender.  ? ?Assessment/Plan: ? ?Proceed with planned endoscopy.  ?Robert Bellow ?02/20/2022 ? ? ?

## 2022-02-20 NOTE — Anesthesia Procedure Notes (Signed)
Date/Time: 02/20/2022 10:26 AM ?Performed by: Demetrius Charity, CRNA ?Pre-anesthesia Checklist: Patient identified, Emergency Drugs available, Suction available, Patient being monitored and Timeout performed ?Patient Re-evaluated:Patient Re-evaluated prior to induction ?Oxygen Delivery Method: Nasal cannula ?Induction Type: IV induction ?Placement Confirmation: CO2 detector and positive ETCO2 ? ? ? ? ?

## 2022-02-21 LAB — SURGICAL PATHOLOGY

## 2022-02-22 ENCOUNTER — Encounter: Payer: Self-pay | Admitting: General Surgery

## 2022-10-23 ENCOUNTER — Other Ambulatory Visit: Payer: Self-pay | Admitting: Gerontology

## 2022-10-23 DIAGNOSIS — J40 Bronchitis, not specified as acute or chronic: Secondary | ICD-10-CM

## 2022-10-23 DIAGNOSIS — I10 Essential (primary) hypertension: Secondary | ICD-10-CM

## 2022-10-23 DIAGNOSIS — R051 Acute cough: Secondary | ICD-10-CM

## 2022-11-07 ENCOUNTER — Ambulatory Visit
Admission: RE | Admit: 2022-11-07 | Discharge: 2022-11-07 | Disposition: A | Discharge status: Home / Self Care | Payer: 59 | Source: Ambulatory Visit | Attending: Gerontology | Admitting: Gerontology

## 2022-11-07 DIAGNOSIS — I10 Essential (primary) hypertension: Secondary | ICD-10-CM

## 2022-11-07 DIAGNOSIS — R051 Acute cough: Secondary | ICD-10-CM

## 2022-11-07 DIAGNOSIS — J40 Bronchitis, not specified as acute or chronic: Secondary | ICD-10-CM

## 2022-11-07 MED ORDER — IOPAMIDOL (ISOVUE-300) INJECTION 61%
75.0000 mL | Freq: Once | INTRAVENOUS | Status: AC | PRN
  Administered 2022-11-07: 75 mL via INTRAVENOUS

## 2024-06-11 ENCOUNTER — Emergency Department
Admission: EM | Admit: 2024-06-11 | Discharge: 2024-06-11 | Disposition: A | Discharge status: Home / Self Care | Attending: Emergency Medicine | Admitting: Emergency Medicine

## 2024-06-11 DIAGNOSIS — T7840XA Allergy, unspecified, initial encounter: Secondary | ICD-10-CM | POA: Diagnosis present

## 2024-06-11 DIAGNOSIS — T782XXA Anaphylactic shock, unspecified, initial encounter: Secondary | ICD-10-CM | POA: Diagnosis not present

## 2024-06-11 MED ORDER — EPINEPHRINE 0.3 MG/0.3ML IJ SOAJ: 0.3000 mg | INTRAMUSCULAR | 2 refills | Status: DC | PRN

## 2024-06-11 MED ORDER — EPINEPHRINE 0.3 MG/0.3ML IJ SOAJ
0.3000 mg | Freq: Once | INTRAMUSCULAR | Status: AC
  Administered 2024-06-11: 0.3 mg via INTRAMUSCULAR
  Filled 2024-06-11: qty 0.3

## 2024-06-11 MED ORDER — FAMOTIDINE IN NACL 20-0.9 MG/50ML-% IV SOLN
20.0000 mg | Freq: Once | INTRAVENOUS | Status: AC
  Administered 2024-06-11: 20 mg via INTRAVENOUS
  Filled 2024-06-11: qty 50

## 2024-06-11 MED ORDER — PREDNISONE 50 MG PO TABS: 50.0000 mg | ORAL_TABLET | Freq: Every day | ORAL | 0 refills | Status: AC

## 2024-06-11 MED ORDER — FAMOTIDINE 20 MG PO TABS: 20.0000 mg | ORAL_TABLET | Freq: Every day | ORAL | 0 refills | Status: DC

## 2024-06-11 MED ORDER — EPINEPHRINE 0.3 MG/0.3ML IJ SOAJ: 0.3000 mg | INTRAMUSCULAR | 2 refills | Status: AC | PRN

## 2024-06-11 MED ORDER — PREDNISONE 50 MG PO TABS: 50.0000 mg | ORAL_TABLET | Freq: Every day | ORAL | 0 refills | Status: DC

## 2024-06-11 MED ORDER — ONDANSETRON HCL 4 MG/2ML IJ SOLN
4.0000 mg | Freq: Once | INTRAMUSCULAR | Status: AC
  Administered 2024-06-11: 4 mg via INTRAVENOUS
  Filled 2024-06-11: qty 2

## 2024-06-11 MED ORDER — FAMOTIDINE 20 MG PO TABS: 20.0000 mg | ORAL_TABLET | Freq: Every day | ORAL | 0 refills | Status: AC

## 2024-06-11 MED ORDER — DIPHENHYDRAMINE HCL 50 MG/ML IJ SOLN
25.0000 mg | Freq: Once | INTRAMUSCULAR | Status: AC
  Administered 2024-06-11: 25 mg via INTRAVENOUS
  Filled 2024-06-11: qty 1

## 2024-06-11 MED ORDER — METHYLPREDNISOLONE SODIUM SUCC 125 MG IJ SOLR
125.0000 mg | Freq: Once | INTRAMUSCULAR | Status: AC
  Administered 2024-06-11: 125 mg via INTRAVENOUS
  Filled 2024-06-11: qty 2

## 2024-06-11 NOTE — ED Notes (Signed)
 Pt reported to this tech she feels like her throat is starting to close up Mumma, MD at bedside

## 2024-06-11 NOTE — ED Triage Notes (Signed)
 Pt to ED from home with allergic rxn to yellow jacket sting. Pt has had Epi pen at approx 1022 and 50 mg Benadryl . Pt has swelling of tongue, no strydor in triage.

## 2024-06-11 NOTE — ED Provider Notes (Addendum)
 [facility] Provider Note    Event Date/Time   First MD Initiated Contact with Patient 06/11/24 1038     (approximate)   History   Allergic Reaction   HPI  Shari Ruiz is a 63 y.o. female presents to the emergency department with symptoms concerning for an allergic reaction.  Patient states that she was stung by a bee just prior to arrival.  Has a history of anaphylaxis to bee stings.  States that she felt like her tongue was swelling and she was having trouble breathing with throat closing so she gave herself her home EpiPen .  States that she took it approximately 30 minutes ago.  Ongoing symptoms of feeling like her tongue is swelling, her throat is closing with nausea and abdominal discomfort.  States that she does not feel well with generalized weakness.  No other prior allergies.  No new daily medications.     Physical Exam   Triage Vital Signs: ED Triage Vitals [06/11/24 1038]  Encounter Vitals Group     BP (!) 157/83     Girls Systolic BP Percentile      Girls Diastolic BP Percentile      Boys Systolic BP Percentile      Boys Diastolic BP Percentile      Pulse Rate (!) 104     Resp 20     Temp 98 F (36.7 C)     Temp Source Oral     SpO2 100 %     Weight      Height      Head Circumference      Peak Flow      Pain Score 4     Pain Loc      Pain Education      Exclude from Growth Chart     Most recent vital signs: Vitals:   06/11/24 1038 06/11/24 1200  BP: (!) 157/83 114/69  Pulse: (!) 104 96  Resp: 20 (!) 21  Temp: 98 F (36.7 C)   SpO2: 100% 98%    Physical Exam Constitutional:      Appearance: She is well-developed.  HENT:     Head: Atraumatic.     Comments: Tongue swelling.  Speaking in full sentences Eyes:     Conjunctiva/sclera: Conjunctivae normal.  Cardiovascular:     Rate and Rhythm: Regular rhythm.  Pulmonary:     Effort: No respiratory distress.  Abdominal:     General: There is no distension.      Tenderness: There is no abdominal tenderness.  Musculoskeletal:        General: Normal range of motion.     Cervical back: Normal range of motion.     Comments: Sting to the left inner thigh  Skin:    General: Skin is warm.  Neurological:     Mental Status: She is alert. Mental status is at baseline.     IMPRESSION / MDM / ASSESSMENT AND PLAN / ED COURSE  I reviewed the triage vital signs and the nursing notes.  Clinical picture is most consistent with anaphylactic reaction.  Will give another dose of epinephrine , Solu-Medrol , Zofran , Pepcid  and Benadryl .  EKG  I, Clotilda Punter, the attending physician, personally viewed and interpreted this ECG.   Rate: Normal  Rhythm: Normal sinus  Axis: Normal  Intervals: Normal  ST&T Change: None  No tachycardic or bradycardic dysrhythmias while on cardiac telemetry.  LABS (all labs ordered are listed, but only abnormal results are  displayed) Labs interpreted as -    Labs Reviewed - No data to display   MDM  Patient was treated for anaphylactic reaction with epinephrine .  After further discussion with the patient she did not hold her EpiPen  down and likely did not get her epinephrine .  Discussed how to use the EpiPen .  Monitor in the emergency department with no progression or recurrence of her anaphylaxis.  Will give a prescription for steroids, Pepcid  and refill of epi.  Discussed return precautions.  Clinical Course as of 06/11/24 1345  Fri Jun 11, 2024  1345 After prolonged monitoring in the emergency department patient states that she is feeling much better and ready to go home.  No concern for respiratory distress and no longer with any tongue swelling.  Given a prescription for prednisone , Pepcid  and EpiPen  and discussed how to use correctly.  Discussed return to the emergency department if her symptoms return. [SM]    Clinical Course User Index [SM] Suzanne Kirsch, MD     PROCEDURES:  Critical Care performed:  yes  .Critical Care  Performed by: Suzanne Kirsch, MD Authorized by: Suzanne Kirsch, MD   Critical care provider statement:    Critical care time (minutes):  30   Critical care time was exclusive of:  Separately billable procedures and treating other patients   Critical care was necessary to treat or prevent imminent or life-threatening deterioration of the following conditions:  Circulatory failure   Critical care was time spent personally by me on the following activities:  Development of treatment plan with patient or surrogate, discussions with consultants, evaluation of patient's response to treatment, examination of patient, ordering and review of laboratory studies, ordering and review of radiographic studies, ordering and performing treatments and interventions, pulse oximetry, re-evaluation of patient's condition and review of old charts   Patient's presentation is most consistent with acute presentation with potential threat to life or bodily function.   MEDICATIONS ORDERED IN ED: Medications  diphenhydrAMINE  (BENADRYL ) injection 25 mg (25 mg Intravenous Given 06/11/24 1115)  EPINEPHrine  (EPI-PEN) injection 0.3 mg (0.3 mg Intramuscular Given 06/11/24 1109)  ondansetron  (ZOFRAN ) injection 4 mg (4 mg Intravenous Given 06/11/24 1127)  methylPREDNISolone  sodium succinate (SOLU-MEDROL ) 125 mg/2 mL injection 125 mg (125 mg Intravenous Given 06/11/24 1114)  famotidine  (PEPCID ) IVPB 20 mg premix (0 mg Intravenous Stopped 06/11/24 1128)    FINAL CLINICAL IMPRESSION(S) / ED DIAGNOSES   Final diagnoses:  Anaphylaxis, initial encounter     Rx / DC Orders   ED Discharge Orders          Ordered    famotidine  (PEPCID ) 20 MG tablet  Daily,   Status:  Discontinued        06/11/24 1051    predniSONE  (DELTASONE ) 50 MG tablet  Daily with breakfast,   Status:  Discontinued        06/11/24 1051    EPINEPHrine  0.3 mg/0.3 mL IJ SOAJ injection  As needed,   Status:  Discontinued        06/11/24 1051     famotidine  (PEPCID ) 20 MG tablet  Daily        06/11/24 1148    predniSONE  (DELTASONE ) 50 MG tablet  Daily with breakfast        06/11/24 1148    EPINEPHrine  0.3 mg/0.3 mL IJ SOAJ injection  As needed        06/11/24 1148             Note:  This document was prepared  using Conservation officer, historic buildings and may include unintentional dictation errors.   Suzanne Kirsch, MD 06/11/24 1051    Suzanne Kirsch, MD 06/11/24 1149    Suzanne Kirsch, MD 06/11/24 1345
